# Patient Record
Sex: Male | Born: 1960 | Race: White | Marital: Married | Smoking: Former smoker
Health system: Southern US, Community
[De-identification: ages and names within clinical notes are randomized; demographics above are authoritative.]

## PROBLEM LIST (undated history)

## (undated) DIAGNOSIS — R0989 Other specified symptoms and signs involving the circulatory and respiratory systems: Secondary | ICD-10-CM

## (undated) DIAGNOSIS — E559 Vitamin D deficiency, unspecified: Secondary | ICD-10-CM

## (undated) DIAGNOSIS — E785 Hyperlipidemia, unspecified: Secondary | ICD-10-CM

## (undated) DIAGNOSIS — E291 Testicular hypofunction: Secondary | ICD-10-CM

## (undated) DIAGNOSIS — F988 Other specified behavioral and emotional disorders with onset usually occurring in childhood and adolescence: Secondary | ICD-10-CM

## (undated) DIAGNOSIS — R7303 Prediabetes: Secondary | ICD-10-CM

## (undated) HISTORY — DX: Other specified behavioral and emotional disorders with onset usually occurring in childhood and adolescence: F98.8

## (undated) HISTORY — DX: Hyperlipidemia, unspecified: E78.5

## (undated) HISTORY — DX: Prediabetes: R73.03

## (undated) HISTORY — DX: Testicular hypofunction: E29.1

## (undated) HISTORY — DX: Other specified symptoms and signs involving the circulatory and respiratory systems: R09.89

## (undated) HISTORY — DX: Vitamin D deficiency, unspecified: E55.9

---

## 1981-03-28 HISTORY — PX: ANKLE SURGERY: SHX546

## 1992-03-28 HISTORY — PX: VASECTOMY: SHX75

## 2004-03-28 HISTORY — PX: SHOULDER SURGERY: SHX246

## 2012-12-03 ENCOUNTER — Other Ambulatory Visit: Payer: Self-pay | Admitting: Internal Medicine

## 2012-12-03 DIAGNOSIS — R748 Abnormal levels of other serum enzymes: Secondary | ICD-10-CM

## 2012-12-06 ENCOUNTER — Other Ambulatory Visit: Payer: Self-pay

## 2013-02-13 ENCOUNTER — Other Ambulatory Visit: Payer: Self-pay | Admitting: *Deleted

## 2013-02-13 DIAGNOSIS — M25562 Pain in left knee: Secondary | ICD-10-CM

## 2013-02-14 ENCOUNTER — Ambulatory Visit
Admission: RE | Admit: 2013-02-14 | Discharge: 2013-02-14 | Disposition: A | Discharge status: Home / Self Care | Payer: No Typology Code available for payment source | Source: Ambulatory Visit | Attending: *Deleted | Admitting: *Deleted

## 2013-02-14 DIAGNOSIS — M25562 Pain in left knee: Secondary | ICD-10-CM

## 2013-03-04 ENCOUNTER — Other Ambulatory Visit: Payer: Self-pay | Admitting: Internal Medicine

## 2013-03-04 ENCOUNTER — Telehealth: Payer: Self-pay | Admitting: Internal Medicine

## 2013-03-04 MED ORDER — AMPHETAMINE-DEXTROAMPHETAMINE 20 MG PO TABS: ORAL_TABLET | ORAL | Status: DC

## 2013-03-04 NOTE — Telephone Encounter (Signed)
refill request Adderall 20MG  TID   Please advise when ready  Next Appt:04-10-13 OV ACollier

## 2013-03-04 NOTE — Telephone Encounter (Signed)
I need this chart please

## 2013-04-08 ENCOUNTER — Other Ambulatory Visit: Payer: Self-pay | Admitting: Emergency Medicine

## 2013-04-08 MED ORDER — AMPHETAMINE-DEXTROAMPHETAMINE 20 MG PO TABS: ORAL_TABLET | ORAL | Status: DC

## 2013-04-09 DIAGNOSIS — E349 Endocrine disorder, unspecified: Secondary | ICD-10-CM | POA: Insufficient documentation

## 2013-04-09 DIAGNOSIS — E785 Hyperlipidemia, unspecified: Secondary | ICD-10-CM | POA: Insufficient documentation

## 2013-04-09 DIAGNOSIS — R7303 Prediabetes: Secondary | ICD-10-CM | POA: Insufficient documentation

## 2013-04-09 DIAGNOSIS — R0989 Other specified symptoms and signs involving the circulatory and respiratory systems: Secondary | ICD-10-CM | POA: Insufficient documentation

## 2013-04-09 DIAGNOSIS — F988 Other specified behavioral and emotional disorders with onset usually occurring in childhood and adolescence: Secondary | ICD-10-CM | POA: Insufficient documentation

## 2013-04-10 ENCOUNTER — Encounter: Payer: Self-pay | Admitting: Physician Assistant

## 2013-04-10 ENCOUNTER — Ambulatory Visit (INDEPENDENT_AMBULATORY_CARE_PROVIDER_SITE_OTHER): Payer: No Typology Code available for payment source | Admitting: Physician Assistant

## 2013-04-10 VITALS — BP 129/84 | HR 76 | Temp 98.2°F | Resp 16 | Ht 71.0 in | Wt 236.0 lb

## 2013-04-10 DIAGNOSIS — I10 Essential (primary) hypertension: Secondary | ICD-10-CM

## 2013-04-10 DIAGNOSIS — R0989 Other specified symptoms and signs involving the circulatory and respiratory systems: Secondary | ICD-10-CM

## 2013-04-10 DIAGNOSIS — E559 Vitamin D deficiency, unspecified: Secondary | ICD-10-CM

## 2013-04-10 DIAGNOSIS — R7309 Other abnormal glucose: Secondary | ICD-10-CM

## 2013-04-10 DIAGNOSIS — Z79899 Other long term (current) drug therapy: Secondary | ICD-10-CM

## 2013-04-10 DIAGNOSIS — R7303 Prediabetes: Secondary | ICD-10-CM

## 2013-04-10 DIAGNOSIS — E785 Hyperlipidemia, unspecified: Secondary | ICD-10-CM

## 2013-04-10 LAB — HEMOGLOBIN A1C
Hgb A1c MFr Bld: 5.9 % — ABNORMAL HIGH (ref <5.7)
MEAN PLASMA GLUCOSE: 123 mg/dL — AB (ref <117)

## 2013-04-10 LAB — MAGNESIUM: Magnesium: 1.9 mg/dL (ref 1.5–2.5)

## 2013-04-10 LAB — LIPID PANEL
CHOLESTEROL: 156 mg/dL (ref 0–200)
HDL: 41 mg/dL (ref >39)
LDL CALC: 97 mg/dL (ref 0–99)
TRIGLYCERIDES: 92 mg/dL (ref <150)
Total CHOL/HDL Ratio: 3.8 Ratio
VLDL: 18 mg/dL (ref 0–40)

## 2013-04-10 LAB — CBC WITH DIFFERENTIAL/PLATELET
BASOS PCT: 0 % (ref 0–1)
Basophils Absolute: 0 10*3/uL (ref 0.0–0.1)
EOS ABS: 0.2 10*3/uL (ref 0.0–0.7)
Eosinophils Relative: 4 % (ref 0–5)
HEMATOCRIT: 47.7 % (ref 39.0–52.0)
HEMOGLOBIN: 16.7 g/dL (ref 13.0–17.0)
Lymphocytes Relative: 25 % (ref 12–46)
Lymphs Abs: 1.4 10*3/uL (ref 0.7–4.0)
MCH: 31.9 pg (ref 26.0–34.0)
MCHC: 35 g/dL (ref 30.0–36.0)
MCV: 91.2 fL (ref 78.0–100.0)
MONO ABS: 0.4 10*3/uL (ref 0.1–1.0)
MONOS PCT: 8 % (ref 3–12)
Neutro Abs: 3.6 10*3/uL (ref 1.7–7.7)
Neutrophils Relative %: 63 % (ref 43–77)
Platelets: 205 10*3/uL (ref 150–400)
RBC: 5.23 MIL/uL (ref 4.22–5.81)
RDW: 14.2 % (ref 11.5–15.5)
WBC: 5.6 10*3/uL (ref 4.0–10.5)

## 2013-04-10 LAB — BASIC METABOLIC PANEL WITH GFR
BUN: 19 mg/dL (ref 6–23)
CALCIUM: 9.5 mg/dL (ref 8.4–10.5)
CO2: 25 mEq/L (ref 19–32)
Chloride: 101 mEq/L (ref 96–112)
Creat: 0.84 mg/dL (ref 0.50–1.35)
GFR, Est Non African American: >89 mL/min
GLUCOSE: 111 mg/dL — AB (ref 70–99)
Potassium: 4.3 mEq/L (ref 3.5–5.3)
Sodium: 133 mEq/L — ABNORMAL LOW (ref 135–145)

## 2013-04-10 LAB — HEPATIC FUNCTION PANEL
ALT: 45 U/L (ref 0–53)
AST: 37 U/L (ref 0–37)
Albumin: 4.6 g/dL (ref 3.5–5.2)
Alkaline Phosphatase: 55 U/L (ref 39–117)
BILIRUBIN INDIRECT: 0.4 mg/dL (ref 0.0–0.9)
Bilirubin, Direct: 0.1 mg/dL (ref 0.0–0.3)
Total Bilirubin: 0.5 mg/dL (ref 0.3–1.2)
Total Protein: 7.2 g/dL (ref 6.0–8.3)

## 2013-04-10 LAB — TSH: TSH: 2.722 u[IU]/mL (ref 0.350–4.500)

## 2013-04-10 MED ORDER — AMPHETAMINE-DEXTROAMPHETAMINE 20 MG PO TABS: ORAL_TABLET | ORAL | Status: DC

## 2013-04-10 NOTE — Progress Notes (Signed)
HPI Patient presents for 3 month follow up with hypertension, hyperlipidemia, prediabetes and vitamin D. Patient's blood pressure has been controlled at home, today their BP is BP: 129/84 mmHg  Patient denies chest pain, shortness of breath, dizziness.  Patient's cholesterol is diet controlled. In addition they are on crestor, he does not take it daily and last visit he was not on it at all and denies myalgias. The cholesterol last visit was LDL 135, HDL 31.AST/ALT 39/57.  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 A1C 5.8 Patient is on Vitamin D supplement.   Current Medications:  Current Outpatient Prescriptions on File Prior to Visit  Medication Sig Dispense Refill  . amphetamine-dextroamphetamine (ADDERALL) 20 MG tablet 1/2 to 1 tablet 2 or 3 x daily as needed for ADD  90 tablet  0  . rosuvastatin (CRESTOR) 20 MG tablet Take 20 mg by mouth daily.      Marland Kitchen. testosterone cypionate (DEPOTESTOTERONE CYPIONATE) 200 MG/ML injection Inject 200 mg into the muscle every 14 (fourteen) days.       No current facility-administered medications on file prior to visit.   Medical History:  Past Medical History  Diagnosis Date  . Hyperlipidemia   . Labile hypertension   . ADD (attention deficit disorder)   . Vitamin D deficiency   . Other testicular hypofunction   . Prediabetes    Allergies: No Known Allergies  ROS Constitutional: Denies fever, chills, headaches, insomnia, fatigue, night sweats Eyes: Denies redness, blurred vision, diplopia, discharge, itchy, watery eyes.  ENT: Denies congestion, post nasal drip, sore throat, earache, dental pain, Tinnitus, Vertigo, Sinus pain, snoring.  Cardio: Denies chest pain, palpitations, irregular heartbeat, dyspnea, diaphoresis, orthopnea, PND, claudication, edema Respiratory: denies cough, shortness of breath, wheezing.  Gastrointestinal:  Denies dysphagia, heartburn, AB pain/ cramps, N/V, diarrhea, constipation, hematemesis, melena, hematochezia,  hemorrhoids Genitourinary: Denies dysuria, frequency, urgency, nocturia, hesitancy, discharge, hematuria, flank pain Musculoskeletal: Denies myalgia, stiffness, pain, swelling and strain/sprain. Skin: Denies pruritis, rash, changing in skin lesion Neuro: Denies Weakness, tremor, incoordination, spasms, pain Psychiatric: Denies confusion, memory loss, sensory loss Endocrine: Denies change in weight, skin, hair change, nocturia Diabetic Polys, Denies visual blurring, hyper /hypo glycemic episodes, and paresthesia, Heme/Lymph: Denies Excessive bleeding, bruising, enlarged lymph nodes  Family history- Review and unchanged Social history- Review and unchanged Physical Exam: Filed Vitals:   04/10/13 0929  BP: 129/84  Pulse: 76  Temp: 98.2 F (36.8 C)  Resp: 16   Filed Weights   04/10/13 0929  Weight: 236 lb (107.049 kg)   General Appearance: Well nourished, in no apparent distress. Eyes: PERRLA, EOMs, conjunctiva no swelling or erythema Sinuses: No Frontal/maxillary tenderness ENT/Mouth: Ext aud canals clear, TMs without erythema, bulging. No erythema, swelling, or exudate on post pharynx.  Tonsils not swollen or erythematous. Hearing normal.  Neck: Supple, thyroid normal.  Respiratory: Respiratory effort normal, BS equal bilaterally without rales, rhonchi, wheezing or stridor.  Cardio: RRR with no MRGs. Brisk peripheral pulses without edema.  Abdomen: Soft, + BS.  Non tender, no guarding, rebound, hernias, masses. Lymphatics: Non tender without lymphadenopathy.  Musculoskeletal: Full ROM, 5/5 strength, normal gait.  Skin: Warm, dry without rashes, lesions, ecchymosis.  Neuro: Cranial nerves intact. Normal muscle tone, no cerebellar symptoms. Sensation intact.  Psych: Awake and oriented X 3, normal affect, Insight and Judgment appropriate.   Assessment and Plan:   Hypertension: Continue  medication, monitor blood pressure at home.  Continue DASH diet. Cholesterol: Continue diet and exercise. Check cholesterol.  Pre-diabetes-Continue diet and exercise. Check A1C Vitamin D Def- check level and continue medications.  Elevated LFTs- will check LFTs again and likely get AB [place] if still elevated.   Continue diet and meds as discussed. Further disposition pending results of labs.  Quentin Mulling 9:34 AM

## 2013-04-10 NOTE — Patient Instructions (Signed)
Bad carbs also include fruit juice, alcohol, and sweet tea. These are empty calories that do not signal to your brain that you are full.   Please remember the good carbs are still carbs which convert into sugar. So please measure them out no more than 1/2-1 cup of rice, oatmeal, pasta, and beans.  Veggies are however free foods! Pile them on.   I like lean protein at every meal such as chicken, [place], pork chops, cottage cheese, etc. Just do not fry these meats and please center your meal around vegetable, the meats should be a side dish.   No all fruit is created equal. Please see the list below, the fruit at the bottom is higher in sugars than the fruit at the top  Your LDL is not in range. Your LDL is the bad cholesterol that can lead to heart attack and stroke. To lower your number you can decrease your fatty foods, red meat, cheese, milk and increase fiber like whole grains and veggies. You can also add a fiber supplement like Metamucil or Benefiber.   Fat and Cholesterol Control Diet Fat and cholesterol levels in your blood and organs are influenced by your diet. High levels of fat and cholesterol may lead to diseases of the heart, small and large blood vessels, gallbladder, liver, and pancreas. CONTROLLING FAT AND CHOLESTEROL WITH DIET Although exercise and lifestyle factors are important, your diet is key. That is because certain foods are known to raise cholesterol and others to lower it. The goal is to balance foods for their effect on cholesterol and more importantly, to replace saturated and trans fat with other types of fat, such as monounsaturated fat, polyunsaturated fat, and omega-3 fatty acids. On average, a person should consume no more than 15 to 17 g of saturated fat daily. Saturated and trans fats are considered "bad" fats, and they will raise LDL cholesterol. Saturated fats are primarily found in animal products such as meats, butter, and cream. However, that does not mean  you need to give up all your favorite foods. Today, there are good tasting, low-fat, low-cholesterol substitutes for most of the things you like to eat. Choose low-fat or nonfat alternatives. Choose round or loin cuts of red meat. These types of cuts are lowest in fat and cholesterol. Chicken (without the skin), fish, veal, and ground [place] breast are great choices. Eliminate fatty meats, such as hot dogs and salami. Even shellfish have little or no saturated fat. Have a 3 oz (85 g) portion when you eat lean meat, poultry, or fish. Trans fats are also called "partially hydrogenated oils." They are oils that have been scientifically manipulated so that they are solid at room temperature resulting in a longer shelf life and improved taste and texture of foods in which they are added. Trans fats are found in stick margarine, some tub margarines, cookies, crackers, and baked goods.  When baking and cooking, oils are a great substitute for butter. The monounsaturated oils are especially beneficial since it is believed they lower LDL and raise HDL. The oils you should avoid entirely are saturated tropical oils, such as coconut and palm.  Remember to eat a lot from food groups that are naturally free of saturated and trans fat, including fish, fruit, vegetables, beans, grains (barley, rice, couscous, bulgur wheat), and pasta (without cream sauces).  IDENTIFYING FOODS THAT LOWER FAT AND CHOLESTEROL  Soluble fiber may lower your cholesterol. This type of fiber is found in fruits such as apples,  vegetables such as broccoli, potatoes, and carrots, legumes such as beans, peas, and lentils, and grains such as barley. Foods fortified with plant sterols (phytosterol) may also lower cholesterol. You should eat at least 2 g per day of these foods for a cholesterol lowering effect.  Read package labels to identify low-saturated fats, trans fat free, and low-fat foods at the supermarket. Select cheeses that have only 2 to 3 g  saturated fat per ounce. Use a heart-healthy tub margarine that is free of trans fats or partially hydrogenated oil. When buying baked goods (cookies, crackers), avoid partially hydrogenated oils. Breads and muffins should be made from whole grains (whole-wheat or whole oat flour, instead of "flour" or "enriched flour"). Buy non-creamy canned soups with reduced salt and no added fats.  FOOD PREPARATION TECHNIQUES  Never deep-fry. If you must fry, either stir-fry, which uses very little fat, or use non-stick cooking sprays. When possible, broil, bake, or roast meats, and steam vegetables. Instead of putting butter or margarine on vegetables, use lemon and herbs, applesauce, and cinnamon (for squash and sweet potatoes). Use nonfat yogurt, salsa, and low-fat dressings for salads.  LOW-SATURATED FAT / LOW-FAT FOOD SUBSTITUTES Meats / Saturated Fat (g)  Avoid: Steak, marbled (3 oz/85 g) / 11 g  Choose: Steak, lean (3 oz/85 g) / 4 g  Avoid: Hamburger (3 oz/85 g) / 7 g  Choose: Hamburger, lean (3 oz/85 g) / 5 g  Avoid: Ham (3 oz/85 g) / 6 g  Choose: Ham, lean cut (3 oz/85 g) / 2.4 g  Avoid: Chicken, with skin, dark meat (3 oz/85 g) / 4 g  Choose: Chicken, skin removed, dark meat (3 oz/85 g) / 2 g  Avoid: Chicken, with skin, light meat (3 oz/85 g) / 2.5 g  Choose: Chicken, skin removed, light meat (3 oz/85 g) / 1 g Dairy / Saturated Fat (g)  Avoid: Whole milk (1 cup) / 5 g  Choose: Low-fat milk, 2% (1 cup) / 3 g  Choose: Low-fat milk, 1% (1 cup) / 1.5 g  Choose: Skim milk (1 cup) / 0.3 g  Avoid: Hard cheese (1 oz/28 g) / 6 g  Choose: Skim milk cheese (1 oz/28 g) / 2 to 3 g  Avoid: Cottage cheese, 4% fat (1 cup) / 6.5 g  Choose: Low-fat cottage cheese, 1% fat (1 cup) / 1.5 g  Avoid: Ice cream (1 cup) / 9 g  Choose: Sherbet (1 cup) / 2.5 g  Choose: Nonfat frozen yogurt (1 cup) / 0.3 g  Choose: Frozen fruit bar / trace  Avoid: Whipped cream (1 tbs) / 3.5 g  Choose: Nondairy  whipped topping (1 tbs) / 1 g Condiments / Saturated Fat (g)  Avoid: Mayonnaise (1 tbs) / 2 g  Choose: Low-fat mayonnaise (1 tbs) / 1 g  Avoid: Butter (1 tbs) / 7 g  Choose: Extra light margarine (1 tbs) / 1 g  Avoid: Coconut oil (1 tbs) / 11.8 g  Choose: Olive oil (1 tbs) / 1.8 g  Choose: Corn oil (1 tbs) / 1.7 g  Choose: Safflower oil (1 tbs) / 1.2 g  Choose: Sunflower oil (1 tbs) / 1.4 g  Choose: Soybean oil (1 tbs) / 2.4 g  Choose: Canola oil (1 tbs) / 1 g Document Released: 03/14/2005 Document Revised: 07/09/2012 Document Reviewed: 09/02/2010 [employer] Patient Information 2014 [employer] [place].

## 2013-04-11 LAB — VITAMIN D 25 HYDROXY (VIT D DEFICIENCY, FRACTURES): VIT D 25 HYDROXY: 88 ng/mL (ref 30–89)

## 2013-05-10 ENCOUNTER — Other Ambulatory Visit: Payer: Self-pay | Admitting: Internal Medicine

## 2013-05-10 MED ORDER — AMPHETAMINE-DEXTROAMPHETAMINE 20 MG PO TABS: ORAL_TABLET | ORAL | Status: DC

## 2013-07-09 ENCOUNTER — Encounter: Payer: Self-pay | Admitting: Internal Medicine

## 2013-07-09 ENCOUNTER — Ambulatory Visit (INDEPENDENT_AMBULATORY_CARE_PROVIDER_SITE_OTHER): Payer: No Typology Code available for payment source | Admitting: Internal Medicine

## 2013-07-09 VITALS — BP 120/74 | HR 80 | Temp 98.1°F | Resp 16 | Ht 71.0 in | Wt 238.4 lb

## 2013-07-09 DIAGNOSIS — R7309 Other abnormal glucose: Secondary | ICD-10-CM

## 2013-07-09 DIAGNOSIS — E782 Mixed hyperlipidemia: Secondary | ICD-10-CM

## 2013-07-09 DIAGNOSIS — E785 Hyperlipidemia, unspecified: Secondary | ICD-10-CM

## 2013-07-09 DIAGNOSIS — R7303 Prediabetes: Secondary | ICD-10-CM

## 2013-07-09 DIAGNOSIS — E559 Vitamin D deficiency, unspecified: Secondary | ICD-10-CM

## 2013-07-09 DIAGNOSIS — E291 Testicular hypofunction: Secondary | ICD-10-CM

## 2013-07-09 DIAGNOSIS — I10 Essential (primary) hypertension: Secondary | ICD-10-CM

## 2013-07-09 DIAGNOSIS — Z79899 Other long term (current) drug therapy: Secondary | ICD-10-CM

## 2013-07-09 DIAGNOSIS — R0989 Other specified symptoms and signs involving the circulatory and respiratory systems: Secondary | ICD-10-CM

## 2013-07-09 LAB — CBC WITH DIFFERENTIAL/PLATELET
Basophils Absolute: 0.1 10*3/uL (ref 0.0–0.1)
Basophils Relative: 1 % (ref 0–1)
EOS PCT: 2 % (ref 0–5)
Eosinophils Absolute: 0.1 10*3/uL (ref 0.0–0.7)
HEMATOCRIT: 50.4 % (ref 39.0–52.0)
HEMOGLOBIN: 17.6 g/dL — AB (ref 13.0–17.0)
LYMPHS ABS: 1.6 10*3/uL (ref 0.7–4.0)
LYMPHS PCT: 26 % (ref 12–46)
MCH: 31.5 pg (ref 26.0–34.0)
MCHC: 34.9 g/dL (ref 30.0–36.0)
MCV: 90.3 fL (ref 78.0–100.0)
MONO ABS: 0.7 10*3/uL (ref 0.1–1.0)
MONOS PCT: 12 % (ref 3–12)
Neutro Abs: 3.5 10*3/uL (ref 1.7–7.7)
Neutrophils Relative %: 59 % (ref 43–77)
Platelets: 173 10*3/uL (ref 150–400)
RBC: 5.58 MIL/uL (ref 4.22–5.81)
RDW: 13.7 % (ref 11.5–15.5)
WBC: 6 10*3/uL (ref 4.0–10.5)

## 2013-07-09 LAB — HEMOGLOBIN A1C
Hgb A1c MFr Bld: 5.5 % (ref <5.7)
MEAN PLASMA GLUCOSE: 111 mg/dL (ref <117)

## 2013-07-09 MED ORDER — AMPHETAMINE-DEXTROAMPHETAMINE 20 MG PO TABS: ORAL_TABLET | ORAL | Status: DC

## 2013-07-09 MED ORDER — CLOMIPHENE CITRATE 50 MG PO TABS: 50.0000 mg | ORAL_TABLET | Freq: Every day | ORAL | Status: AC

## 2013-07-09 NOTE — Progress Notes (Signed)
Patient ID: Joshua CasterRobert Bodi, male   DOB: Apr 08, 1960, 53 y.o.   MRN: [id number]    This very nice 53 y.o. male presents for 3 month follow up with Hypertension, Hyperlipidemia, Pre-Diabetes and Vitamin D Deficiency. Patient is also treated for ADD with marked improvement in focusing and concentration on treatment.    HTN predates since 2002. BP has been controlled at home. Today's BP: 120/74 mmHg . Patient denies any cardiac type chest pain, palpitations, dyspnea/orthopnea/PND, dizziness, claudication, or dependent edema.   Hyperlipidemia is controlled with diet & meds. Last Cholesterol was  156, Triglycerides were  92, HDL  41 and LDL 97 in Jan 2015 - all at goal. Patient denies myalgias or other med SE's.    Also, the patient has history of PreDiabetes with A1c 5.8% in 2011 and with last A1c of  5.9% in Jan 2015. Patient denies any symptoms of reactive hypoglycemia, diabetic polys, paresthesias or visual blurring.   Patient has testosterone deficiency and has been on replacement with Depo-testosterone. Further, Patient has history of Vitamin D Deficiency  Of 16 in 2008 with last vitamin D of 62 in Oct 2014. Patient supplements vitamin D without any suspected side-effects.  Medication Sig  .  ADDERALL 20 MG tablet 1/2 to 1 tablet 2 or 3 x daily as needed for ADD  . rosuvastatin (CRESTOR) 20 MG tablet Take 20 mg by mouth daily.  . DEPOTESTOTERONE 200 MG/ML  Inject 200 mg into the muscle every 14 (fourteen) days.    No Known Allergies  PMHx:   Past Medical History  Diagnosis Date  . Hyperlipidemia   . Labile hypertension   . ADD (attention deficit disorder)   . Vitamin D deficiency   . Other testicular hypofunction   . Prediabetes    FHx:    Reviewed / unchanged  SHx:    Reviewed / unchanged   Systems Review: Constitutional: Denies fever, chills, wt changes, headaches, insomnia, fatigue, night sweats, change in appetite. Eyes: Denies redness, blurred vision, diplopia, discharge, itchy,  watery eyes.  ENT: Denies discharge, congestion, post nasal drip, epistaxis, sore throat, earache, hearing loss, dental pain, tinnitus, vertigo, sinus pain, snoring.  CV: Denies chest pain, palpitations, irregular heartbeat, syncope, dyspnea, diaphoresis, orthopnea, PND, claudication, edema. Respiratory: denies cough, dyspnea, DOE, pleurisy, hoarseness, laryngitis, wheezing.  Gastrointestinal: Denies dysphagia, odynophagia, heartburn, reflux, water brash, abdominal pain or cramps, nausea, vomiting, bloating, diarrhea, constipation, hematemesis, melena, hematochezia,  or hemorrhoids. Genitourinary: Denies dysuria, frequency, urgency, nocturia, hesitancy, discharge, hematuria, flank pain. Musculoskeletal: Denies arthralgias, myalgias, stiffness, jt. swelling, pain, limp, strain/sprain.  Skin: Denies pruritus, rash, hives, warts, acne, eczema, change in skin lesion(s). Neuro: No weakness, tremor, incoordination, spasms, paresthesia, or pain. Psychiatric: Denies confusion, memory loss, or sensory loss. Endo: Denies change in weight, skin, hair change.  Heme/Lymph: No excessive bleeding, bruising, orenlarged lymph nodes.  Exam:    BP 120/74  Pulse 80  Temp 98.1 F   Resp 16  Ht 5\' 11"    Wt 238 lb 6.4 oz   BMI 33.26 kg/m2  Appears well nourished - in no distress. Eyes: PERRLA, EOMs, conjunctiva no swelling or erythema. Sinuses: No frontal/maxillary tenderness ENT/Mouth: EAC's clear, TM's nl w/o erythema, bulging. Nares clear w/o erythema, swelling, exudates. Oropharynx clear without erythema or exudates. Oral hygiene is good. Tongue normal, non obstructing. Hearing intact.  Neck: Supple. Thyroid nl. Car 2+/2+ without bruits, nodes or JVD. Chest: Respirations nl with BS clear & equal w/o rales, rhonchi, wheezing or stridor.  Cor: Heart  sounds normal w/ regular rate and rhythm without sig. murmurs, gallops, clicks, or rubs. Peripheral pulses normal and equal  without edema.  Abdomen: Soft &  bowel sounds normal. Non-tender w/o guarding, rebound, hernias, masses, or organomegaly.  Lymphatics: Unremarkable.  Musculoskeletal: Full ROM all peripheral extremities, joint stability, 5/5 strength, and normal gait.  Skin: Warm, dry without exposed rashes, lesions, ecchymosis apparent.  Neuro: Cranial nerves intact, reflexes equal bilaterally. Sensory-motor testing grossly intact. Tendon reflexes grossly intact.  Pysch: Alert & oriented x 3. Insight and judgement nl & appropriate. No ideations.  Assessment and Plan:  1. Hypertension - Continue monitor blood pressure at home. Continue diet/meds same.  2. Hyperlipidemia - Continue diet/meds, exercise,& lifestyle modifications. Continue monitor periodic cholesterol/liver & renal functions   3. Pre-diabetes - Continue diet, exercise, lifestyle modifications. Monitor appropriate labs.  4. Vitamin D Deficiency - Continue supplementation.  5. Testosterone Deficiency - In discussion with a urologist and the patient he is willing to try treatment with Clomid 50 mg qd in lieu of D-T injections and re-check levels in 3 months on Tx.  Recommended regular exercise, BP monitoring, weight control, and discussed med and SE's. Recommended labs to assess and monitor clinical status. Further disposition pending results of labs.

## 2013-07-09 NOTE — Patient Instructions (Signed)
 Testosterone injection What is this medicine? TESTOSTERONE (tes TOS ter one) is the main male hormone. It supports normal male development such as muscle growth, facial hair, and deep voice. It is used in males to treat low testosterone levels. This medicine may be used for other purposes; ask your health care provider or pharmacist if you have questions. COMMON BRAND NAME(S): Andro-L.A., Aveed, Delatestryl, Depo-Testosterone, Virilon What should I tell my health care provider before I take this medicine? They need to know if you have any of these conditions: -breast cancer -diabetes -heart disease -kidney disease -liver disease -lung disease -prostate cancer, enlargement -an unusual or allergic reaction to testosterone, other medicines, foods, dyes, or preservatives -pregnant or trying to get pregnant -breast-feeding How should I use this medicine? This medicine is for injection into a muscle. It is usually given by a health care professional in a hospital or clinic setting. Contact your pediatrician regarding the use of this medicine in children. While this medicine may be prescribed for children as young as 12 years of age for selected conditions, precautions do apply. Overdosage: If you think you have taken too much of this medicine contact a poison control center or emergency room at once. NOTE: This medicine is only for you. Do not share this medicine with others. What if I miss a dose? Try not to miss a dose. Your doctor or health care professional will tell you when your next injection is due. Notify the office if you are unable to keep an appointment. What may interact with this medicine? -medicines for diabetes -medicines that treat or prevent blood clots like warfarin -oxyphenbutazone -propranolol -steroid medicines like prednisone or cortisone This list may not describe all possible interactions. Give your health care provider a list of all the medicines, herbs,  non-prescription drugs, or dietary supplements you use. Also tell them if you smoke, drink alcohol, or use illegal drugs. Some items may interact with your medicine. What should I watch for while using this medicine? Visit your doctor or health care professional for regular checks on your progress. They will need to check the level of testosterone in your blood. This medicine may affect blood sugar levels. If you have diabetes, check with your doctor or health care professional before you change your diet or the dose of your diabetic medicine. This drug is banned from use in athletes by most athletic organizations. What side effects may I notice from receiving this medicine? Side effects that you should report to your doctor or health care professional as soon as possible: -allergic reactions like skin rash, itching or hives, swelling of the face, lips, or tongue -breast enlargement -breathing problems -changes in mood, especially anger, depression, or rage -dark urine -general ill feeling or flu-like symptoms -light-colored stools -loss of appetite, nausea -nausea, vomiting -right upper belly pain -stomach pain -swelling of ankles -too frequent or persistent erections -trouble passing urine or change in the amount of urine -unusually weak or tired -yellowing of the eyes or skin Additional side effects that can occur in women include: -deep or hoarse voice -facial hair growth -irregular menstrual periods Side effects that usually do not require medical attention (report to your doctor or health care professional if they continue or are bothersome): -acne -change in sex drive or performance -hair loss -headache This list may not describe all possible side effects. Call your doctor for medical advice about side effects. You may report side effects to FDA at 1-800-FDA-1088. Where should I keep my medicine?   Keep out of the reach of children. This medicine can be abused. Keep your  medicine in a safe place to protect it from theft. Do not share this medicine with anyone. Selling or giving away this medicine is dangerous and against the law. Store at room temperature between 20 and 25 degrees C (68 and 77 degrees F). Do not freeze. Protect from light. Follow the directions for the product you are prescribed. Throw away any unused medicine after the expiration date. NOTE: This sheet is a summary. It may not cover all possible information. If you have questions about this medicine, talk to your doctor, pharmacist, or health care provider.  2014, [employer]vier/Gold Standard. (2007-05-25 16:13:46)  Hypertension As your heart beats, it forces blood through your arteries. This force is your blood pressure. If the pressure is too high, it is called hypertension (HTN) or high blood pressure. HTN is dangerous because you may have it and not know it. High blood pressure may mean that your heart has to work harder to pump blood. Your arteries may be narrow or stiff. The extra work puts you at risk for heart disease, stroke, and other problems.  Blood pressure consists of two numbers, a higher number over a lower, 110/72, for example. It is stated as "110 over 72." The ideal is below 120 for the top number (systolic) and under 80 for the bottom (diastolic). Write down your blood pressure today. You should pay close attention to your blood pressure if you have certain conditions such as:  Heart failure.  Prior heart attack.  Diabetes  Chronic kidney disease.  Prior stroke.  Multiple risk factors for heart disease. To see if you have HTN, your blood pressure should be measured while you are seated with your arm held at the level of the heart. It should be measured at least twice. A one-time elevated blood pressure reading (especially in the Emergency Department) does not mean that you need treatment. There may be conditions in which the blood pressure is different between your right and left  arms. It is important to see your caregiver soon for a recheck. Most people have essential hypertension which means that there is not a specific cause. This type of high blood pressure may be lowered by changing lifestyle factors such as:  Stress.  Smoking.  Lack of exercise.  Excessive weight.  Drug/tobacco/alcohol use.  Eating less salt. Most people do not have symptoms from high blood pressure until it has caused damage to the body. Effective treatment can often prevent, delay or reduce that damage. TREATMENT  When a cause has been identified, treatment for high blood pressure is directed at the cause. There are a large number of medications to treat HTN. These fall into several categories, and your caregiver will help you select the medicines that are best for you. Medications may have side effects. You should review side effects with your caregiver. If your blood pressure stays high after you have made lifestyle changes or started on medicines,   Your medication(s) may need to be changed.  Other problems may need to be addressed.  Be certain you understand your prescriptions, and know how and when to take your medicine.  Be sure to follow up with your caregiver within the time frame advised (usually within two weeks) to have your blood pressure rechecked and to review your medications.  If you are taking more than one medicine to lower your blood pressure, make sure you know how and at what times   they should be taken. Taking two medicines at the same time can result in blood pressure that is too low. SEEK IMMEDIATE MEDICAL CARE IF:  You develop a severe headache, blurred or changing vision, or confusion.  You have unusual weakness or numbness, or a faint feeling.  You have severe chest or abdominal pain, vomiting, or breathing problems. MAKE SURE YOU:   Understand these instructions.  Will watch your condition.  Will get help right away if you are not doing well or get  worse.   Diabetes and Exercise Exercising regularly is important. It is not just about losing weight. It has many health benefits, such as:  Improving your overall fitness, flexibility, and endurance.  Increasing your bone density.  Helping with weight control.  Decreasing your body fat.  Increasing your muscle strength.  Reducing stress and tension.  Improving your overall health. People with diabetes who exercise gain additional benefits because exercise:  Reduces appetite.  Improves the body's use of blood sugar (glucose).  Helps lower or control blood glucose.  Decreases blood pressure.  Helps control blood lipids (such as cholesterol and triglycerides).  Improves the body's use of the hormone insulin by:  Increasing the body's insulin sensitivity.  Reducing the body's insulin needs.  Decreases the risk for heart disease because exercising:  Lowers cholesterol and triglycerides levels.  Increases the levels of good cholesterol (such as high-density lipoproteins [HDL]) in the body.  Lowers blood glucose levels. YOUR ACTIVITY PLAN  Choose an activity that you enjoy and set realistic goals. Your health care provider or diabetes educator can help you make an activity plan that works for you. You can break activities into 2 or 3 sessions throughout the day. Doing so is as good as one long session. Exercise ideas include:  Taking the dog for a walk.  Taking the stairs instead of the elevator.  Dancing to your favorite song.  Doing your favorite exercise with a friend. RECOMMENDATIONS FOR EXERCISING WITH TYPE 1 OR TYPE 2 DIABETES   Check your blood glucose before exercising. If blood glucose levels are greater than 240 mg/dL, check for urine ketones. Do not exercise if ketones are present.  Avoid injecting insulin into areas of the body that are going to be exercised. For example, avoid injecting insulin into:  The arms when playing tennis.  The legs when  jogging.  Keep a record of:  Food intake before and after you exercise.  Expected peak times of insulin action.  Blood glucose levels before and after you exercise.  The type and amount of exercise you have done.  Review your records with your health care provider. Your health care provider will help you to develop guidelines for adjusting food intake and insulin amounts before and after exercising.  If you take insulin or oral hypoglycemic agents, watch for signs and symptoms of hypoglycemia. They include:  Dizziness.  Shaking.  Sweating.  Chills.  Confusion.  Drink plenty of water while you exercise to prevent dehydration or heat stroke. Body water is lost during exercise and must be replaced.  Talk to your health care provider before starting an exercise program to make sure it is safe for you. Remember, almost any type of activity is better than none.    Cholesterol Cholesterol is a white, waxy, fat-like protein needed by your body in small amounts. The liver makes all the cholesterol you need. It is carried from the liver by the blood through the blood vessels. Deposits (plaque) may build   up on blood vessel walls. This makes the arteries narrower and stiffer. Plaque increases the risk for heart attack and stroke. You cannot feel your cholesterol level even if it is very high. The only way to know is by a blood test to check your lipid (fats) levels. Once you know your cholesterol levels, you should keep a record of the test results. Work with your caregiver to to keep your levels in the desired range. WHAT THE RESULTS MEAN:  Total cholesterol is a rough measure of all the cholesterol in your blood.  LDL is the so-called bad cholesterol. This is the type that deposits cholesterol in the walls of the arteries. You want this level to be low.  HDL is the good cholesterol because it cleans the arteries and carries the LDL away. You want this level to be high.  Triglycerides  are fat that the body can either burn for energy or store. High levels are closely linked to heart disease. DESIRED LEVELS:  Total cholesterol below 200.  LDL below 100 for people at risk, below 70 for very high risk.  HDL above 50 is good, above 60 is best.  Triglycerides below 150. HOW TO LOWER YOUR CHOLESTEROL:  Diet.  Choose fish or white meat chicken and turkey, roasted or baked. Limit fatty cuts of red meat, fried foods, and processed meats, such as sausage and lunch meat.  Eat lots of fresh fruits and vegetables. Choose whole grains, beans, pasta, potatoes and cereals.  Use only small amounts of olive, corn or canola oils. Avoid butter, mayonnaise, shortening or palm kernel oils. Avoid foods with trans-fats.  Use skim/nonfat milk and low-fat/nonfat yogurt and cheeses. Avoid whole milk, cream, ice cream, egg yolks and cheeses. Healthy desserts include angel food cake, ginger snaps, animal crackers, hard candy, popsicles, and low-fat/nonfat frozen yogurt. Avoid pastries, cakes, pies and cookies.  Exercise.  A regular program helps decrease LDL and raises HDL.  Helps with weight control.  Do things that increase your activity level like gardening, walking, or taking the stairs.  Medication.  May be prescribed by your caregiver to help lowering cholesterol and the risk for heart disease.  You may need medicine even if your levels are normal if you have several risk factors. HOME CARE INSTRUCTIONS   Follow your diet and exercise programs as suggested by your caregiver.  Take medications as directed.  Have blood work done when your caregiver feels it is necessary. MAKE SURE YOU:   Understand these instructions.  Will watch your condition.  Will get help right away if you are not doing well or get worse.      Vitamin D Deficiency Vitamin D is an important vitamin that your body needs. Having too little of it in your body is called a deficiency. A very bad  deficiency can make your bones soft and can cause a condition called rickets.  Vitamin D is important to your body for different reasons, such as:   It helps your body absorb 2 minerals called calcium and phosphorus.  It helps make your bones healthy.  It may prevent some diseases, such as diabetes and multiple sclerosis.  It helps your muscles and heart. You can get vitamin D in several ways. It is a natural part of some foods. The vitamin is also added to some dairy products and cereals. Some people take vitamin D supplements. Also, your body makes vitamin D when you are in the sun. It changes the sun's rays into a   form of the vitamin that your body can use. CAUSES   Not eating enough foods that contain vitamin D.  Not getting enough sunlight.  Having certain digestive system diseases that make it hard to absorb vitamin D. These diseases include Crohn's disease, chronic pancreatitis, and cystic fibrosis.  Having a surgery in which part of the stomach or small intestine is removed.  Being obese. Fat cells pull vitamin D out of your blood. That means that obese people may not have enough vitamin D left in their blood and in other body tissues.  Having chronic kidney or liver disease. RISK FACTORS Risk factors are things that make you more likely to develop a vitamin D deficiency. They include:  Being older.  Not being able to get outside very much.  Living in a nursing home.  Having had broken bones.  Having weak or thin bones (osteoporosis).  Having a disease or condition that changes how your body absorbs vitamin D.  Having dark skin.  Some medicines such as seizure medicines or steroids.  Being overweight or obese. SYMPTOMS Mild cases of vitamin D deficiency may not have any symptoms. If you have a very bad case, symptoms may include:  Bone pain.  Muscle pain.  Falling often.  Broken bones caused by a minor injury, due to osteoporosis. DIAGNOSIS A blood test  is the best way to tell if you have a vitamin D deficiency. TREATMENT Vitamin D deficiency can be treated in different ways. Treatment for vitamin D deficiency depends on what is causing it. Options include:  Taking vitamin D supplements.  Taking a calcium supplement. Your caregiver will suggest what dose is best for you. HOME CARE INSTRUCTIONS  Take any supplements that your caregiver prescribes. Follow the directions carefully. Take only the suggested amount.  Have your blood tested 2 months after you start taking supplements.  Eat foods that contain vitamin D. Healthy choices include:  Fortified dairy products, cereals, or juices. Fortified means vitamin D has been added to the food. Check the label on the package to be sure.  Fatty fish like salmon or trout.  Eggs.  Oysters.  Do not use a tanning bed.  Keep your weight at a healthy level. Lose weight if you need to.  Keep all follow-up appointments. Your caregiver will need to perform blood tests to make sure your vitamin D deficiency is going away. SEEK MEDICAL CARE IF:  You have any questions about your treatment.  You continue to have symptoms of vitamin D deficiency.  You have nausea or vomiting.  You are constipated.  You feel confused.  You have severe abdominal or back pain. MAKE SURE YOU:  Understand these instructions.  Will watch your condition.  Will get help right away if you are not doing well or get worse.   

## 2013-07-10 LAB — HEPATIC FUNCTION PANEL
ALBUMIN: 4.1 g/dL (ref 3.5–5.2)
ALK PHOS: 51 U/L (ref 39–117)
ALT: 42 U/L (ref 0–53)
AST: 35 U/L (ref 0–37)
Bilirubin, Direct: 0.1 mg/dL (ref 0.0–0.3)
Indirect Bilirubin: 0.5 mg/dL (ref 0.2–1.2)
TOTAL PROTEIN: 6.6 g/dL (ref 6.0–8.3)
Total Bilirubin: 0.6 mg/dL (ref 0.2–1.2)

## 2013-07-10 LAB — BASIC METABOLIC PANEL WITH GFR
BUN: 19 mg/dL (ref 6–23)
CHLORIDE: 102 meq/L (ref 96–112)
CO2: 28 mEq/L (ref 19–32)
CREATININE: 0.93 mg/dL (ref 0.50–1.35)
Calcium: 9.2 mg/dL (ref 8.4–10.5)
GFR, Est African American: >89 mL/min
GFR, Est Non African American: >89 mL/min
GLUCOSE: 89 mg/dL (ref 70–99)
Potassium: 4.1 mEq/L (ref 3.5–5.3)
Sodium: 138 mEq/L (ref 135–145)

## 2013-07-10 LAB — INSULIN, FASTING: INSULIN FASTING, SERUM: 14 u[IU]/mL (ref 3–28)

## 2013-07-10 LAB — TESTOSTERONE: Testosterone: 624 ng/dL (ref 300–890)

## 2013-07-10 LAB — TSH: TSH: 2.553 u[IU]/mL (ref 0.350–4.500)

## 2013-07-10 LAB — LIPID PANEL
Cholesterol: 131 mg/dL (ref 0–200)
HDL: 27 mg/dL — ABNORMAL LOW (ref >39)
LDL CALC: 78 mg/dL (ref 0–99)
Total CHOL/HDL Ratio: 4.9 Ratio
Triglycerides: 130 mg/dL (ref <150)
VLDL: 26 mg/dL (ref 0–40)

## 2013-07-10 LAB — VITAMIN D 25 HYDROXY (VIT D DEFICIENCY, FRACTURES): Vit D, 25-Hydroxy: 68 ng/mL (ref 30–89)

## 2013-07-10 LAB — MAGNESIUM: Magnesium: 1.9 mg/dL (ref 1.5–2.5)

## 2013-08-12 ENCOUNTER — Other Ambulatory Visit: Payer: Self-pay | Admitting: Internal Medicine

## 2013-08-12 DIAGNOSIS — F988 Other specified behavioral and emotional disorders with onset usually occurring in childhood and adolescence: Secondary | ICD-10-CM

## 2013-08-12 DIAGNOSIS — E782 Mixed hyperlipidemia: Secondary | ICD-10-CM

## 2013-08-12 MED ORDER — ROSUVASTATIN CALCIUM 20 MG PO TABS: 20.0000 mg | ORAL_TABLET | Freq: Every day | ORAL | Status: DC

## 2013-08-12 MED ORDER — AMPHETAMINE-DEXTROAMPHETAMINE 20 MG PO TABS: ORAL_TABLET | ORAL | Status: DC

## 2013-09-11 ENCOUNTER — Other Ambulatory Visit: Payer: Self-pay | Admitting: Internal Medicine

## 2013-09-11 DIAGNOSIS — F988 Other specified behavioral and emotional disorders with onset usually occurring in childhood and adolescence: Secondary | ICD-10-CM

## 2013-09-11 MED ORDER — AMPHETAMINE-DEXTROAMPHETAMINE 20 MG PO TABS: ORAL_TABLET | ORAL | Status: DC

## 2013-10-14 ENCOUNTER — Ambulatory Visit: Payer: Self-pay | Admitting: Emergency Medicine

## 2013-11-07 ENCOUNTER — Other Ambulatory Visit: Payer: Self-pay | Admitting: Internal Medicine

## 2013-11-07 DIAGNOSIS — F988 Other specified behavioral and emotional disorders with onset usually occurring in childhood and adolescence: Secondary | ICD-10-CM

## 2013-11-07 MED ORDER — AMPHETAMINE-DEXTROAMPHETAMINE 20 MG PO TABS: ORAL_TABLET | ORAL | Status: DC

## 2013-12-11 ENCOUNTER — Other Ambulatory Visit: Payer: Self-pay | Admitting: Internal Medicine

## 2013-12-11 DIAGNOSIS — F988 Other specified behavioral and emotional disorders with onset usually occurring in childhood and adolescence: Secondary | ICD-10-CM

## 2013-12-11 MED ORDER — AMPHETAMINE-DEXTROAMPHETAMINE 20 MG PO TABS: ORAL_TABLET | ORAL | Status: DC

## 2014-01-06 ENCOUNTER — Encounter: Payer: Self-pay | Admitting: Internal Medicine

## 2014-01-27 ENCOUNTER — Ambulatory Visit: Payer: Self-pay | Admitting: Internal Medicine

## 2014-01-30 ENCOUNTER — Other Ambulatory Visit: Payer: Self-pay | Admitting: Internal Medicine

## 2014-01-30 DIAGNOSIS — E782 Mixed hyperlipidemia: Secondary | ICD-10-CM

## 2014-01-30 DIAGNOSIS — F988 Other specified behavioral and emotional disorders with onset usually occurring in childhood and adolescence: Secondary | ICD-10-CM

## 2014-01-30 MED ORDER — AMPHETAMINE-DEXTROAMPHETAMINE 20 MG PO TABS: ORAL_TABLET | ORAL | Status: DC

## 2014-01-30 MED ORDER — ROSUVASTATIN CALCIUM 20 MG PO TABS: 20.0000 mg | ORAL_TABLET | Freq: Every day | ORAL | Status: DC

## 2014-03-06 ENCOUNTER — Other Ambulatory Visit: Payer: Self-pay | Admitting: Internal Medicine

## 2014-03-06 DIAGNOSIS — F988 Other specified behavioral and emotional disorders with onset usually occurring in childhood and adolescence: Secondary | ICD-10-CM

## 2014-03-06 MED ORDER — AMPHETAMINE-DEXTROAMPHETAMINE 20 MG PO TABS: ORAL_TABLET | ORAL | Status: DC

## 2014-03-14 ENCOUNTER — Ambulatory Visit: Payer: Self-pay | Admitting: Internal Medicine

## 2014-04-01 ENCOUNTER — Ambulatory Visit: Payer: Self-pay | Admitting: Internal Medicine

## 2014-04-09 ENCOUNTER — Other Ambulatory Visit: Payer: Self-pay | Admitting: Internal Medicine

## 2014-04-09 DIAGNOSIS — F988 Other specified behavioral and emotional disorders with onset usually occurring in childhood and adolescence: Secondary | ICD-10-CM

## 2014-04-09 MED ORDER — AMPHETAMINE-DEXTROAMPHETAMINE 20 MG PO TABS: ORAL_TABLET | ORAL | Status: DC

## 2014-04-17 ENCOUNTER — Ambulatory Visit (INDEPENDENT_AMBULATORY_CARE_PROVIDER_SITE_OTHER): Payer: No Typology Code available for payment source | Admitting: Internal Medicine

## 2014-04-17 ENCOUNTER — Encounter: Payer: Self-pay | Admitting: Internal Medicine

## 2014-04-17 VITALS — BP 116/80 | HR 68 | Temp 98.8°F | Resp 16 | Ht 71.0 in | Wt 240.0 lb

## 2014-04-17 DIAGNOSIS — R0989 Other specified symptoms and signs involving the circulatory and respiratory systems: Secondary | ICD-10-CM

## 2014-04-17 DIAGNOSIS — E559 Vitamin D deficiency, unspecified: Secondary | ICD-10-CM

## 2014-04-17 DIAGNOSIS — E785 Hyperlipidemia, unspecified: Secondary | ICD-10-CM

## 2014-04-17 DIAGNOSIS — F988 Other specified behavioral and emotional disorders with onset usually occurring in childhood and adolescence: Secondary | ICD-10-CM

## 2014-04-17 DIAGNOSIS — E349 Endocrine disorder, unspecified: Secondary | ICD-10-CM

## 2014-04-17 DIAGNOSIS — R7309 Other abnormal glucose: Secondary | ICD-10-CM

## 2014-04-17 DIAGNOSIS — I10 Essential (primary) hypertension: Secondary | ICD-10-CM

## 2014-04-17 DIAGNOSIS — Z79899 Other long term (current) drug therapy: Secondary | ICD-10-CM

## 2014-04-17 DIAGNOSIS — R7303 Prediabetes: Secondary | ICD-10-CM

## 2014-04-17 LAB — CBC WITH DIFFERENTIAL/PLATELET
Basophils Absolute: 0.1 10*3/uL (ref 0.0–0.1)
Basophils Relative: 1 % (ref 0–1)
Eosinophils Absolute: 0.2 10*3/uL (ref 0.0–0.7)
Eosinophils Relative: 3 % (ref 0–5)
HCT: 45.2 % (ref 39.0–52.0)
Hemoglobin: 16.1 g/dL (ref 13.0–17.0)
Lymphocytes Relative: 26 % (ref 12–46)
Lymphs Abs: 1.4 10*3/uL (ref 0.7–4.0)
MCH: 32.2 pg (ref 26.0–34.0)
MCHC: 35.6 g/dL (ref 30.0–36.0)
MCV: 90.4 fL (ref 78.0–100.0)
MPV: 9.2 fL (ref 8.6–12.4)
Monocytes Absolute: 0.5 10*3/uL (ref 0.1–1.0)
Monocytes Relative: 9 % (ref 3–12)
Neutro Abs: 3.2 10*3/uL (ref 1.7–7.7)
Neutrophils Relative %: 61 % (ref 43–77)
Platelets: 183 10*3/uL (ref 150–400)
RBC: 5 MIL/uL (ref 4.22–5.81)
RDW: 13.5 % (ref 11.5–15.5)
WBC: 5.2 10*3/uL (ref 4.0–10.5)

## 2014-04-17 LAB — HEMOGLOBIN A1C
Hgb A1c MFr Bld: 5.7 % — ABNORMAL HIGH (ref <5.7)
Mean Plasma Glucose: 117 mg/dL — ABNORMAL HIGH (ref <117)

## 2014-04-17 LAB — TESTOSTERONE: Testosterone: 242 ng/dL — ABNORMAL LOW (ref 300–890)

## 2014-04-17 NOTE — Progress Notes (Signed)
Patient ID: Joshua CasterRobert Curiale, male   DOB: Jan 17, 1961, 54 y.o.   MRN: [id number]   This very nice 54 y.o. MWM presents for 3 month follow up with Hypertension, Hyperlipidemia, Pre-Diabetes, ADD, Testosterone  and Vitamin D Deficiency.    Patient has hx/o labile HTN & has been monitored expectantly. BP has been controlled at home. Today's BP: 116/80 mmHg. Patient has had no complaints of any cardiac type chest pain, palpitations, dyspnea/orthopnea/PND, dizziness, claudication, or dependent edema.   Hyperlipidemia is controlled with diet & meds. Patient denies myalgias or other med SE's. Last Lipids were at goal - Total  Cholesterol, 131; HDL  27; LDL 78; Trig 130 on 07/09/2013.   Also, the patient has Morbid Obesity with BMI 33.49 & consequent  history of PreDiabetes with A1c 5.8% in 2011 and has had no symptoms of reactive hypoglycemia, diabetic polys, paresthesias or visual blurring.  Last A1c was  5.5% on  07/09/2013.   Patient has Testosterone deficiency predating since 2003 with a level of 178 - and he has been inconsistently on treatment with Depo Testerone and most recently Clomid which he had stopped. He does desire to restart treatment with injectable Testosterone. Further, the patient also has history of Vitamin D Deficiency of 16 in 2008 and supplements vitamin D without any suspected side-effects. Last vitamin D was 68 on 07/09/2013.     Medication List   clomiPHENE 50 MG tablet  - OFF  Commonly known as:  CLOMID -  Take 1 tablet (50 mg total) by mouth daily.     rosuvastatin 20 MG tablet  Commonly known as:  CRESTOR  Take 1 tablet (20 mg total) by mouth daily. For cholesterol     No Known Allergies  PMHx:   Past Medical History  Diagnosis Date  . Hyperlipidemia   . Labile hypertension   . ADD (attention deficit disorder)   . Vitamin D deficiency   . Other testicular hypofunction   . Prediabetes    Immunization History  Administered Date(s) Administered  . DT 09/20/2006  .  Pneumococcal-Unspecified 03/28/1998   Past Surgical History  Procedure Laterality Date  . Shoulder surgery Right 2006  . Vasectomy  1994  . Ankle surgery Right 1983   FHx:    Reviewed / unchanged  SHx:    Reviewed / unchanged  Systems Review:  Constitutional: Denies fever, chills, wt changes, headaches, insomnia, fatigue, night sweats, change in appetite. Eyes: Denies redness, blurred vision, diplopia, discharge, itchy, watery eyes.  ENT: Denies discharge, congestion, post nasal drip, epistaxis, sore throat, earache, hearing loss, dental pain, tinnitus, vertigo, sinus pain, snoring.  CV: Denies chest pain, palpitations, irregular heartbeat, syncope, dyspnea, diaphoresis, orthopnea, PND, claudication or edema. Respiratory: denies cough, dyspnea, DOE, pleurisy, hoarseness, laryngitis, wheezing.  Gastrointestinal: Denies dysphagia, odynophagia, heartburn, reflux, water brash, abdominal pain or cramps, nausea, vomiting, bloating, diarrhea, constipation, hematemesis, melena, hematochezia  or hemorrhoids. Genitourinary: Denies dysuria, frequency, urgency, nocturia, hesitancy, discharge, hematuria or flank pain. Musculoskeletal: Denies arthralgias, myalgias, stiffness, jt. swelling, pain, limping or strain/sprain.  Skin: Denies pruritus, rash, hives, warts, acne, eczema or change in skin lesion(s). Neuro: No weakness, tremor, incoordination, spasms, paresthesia or pain. Psychiatric: Denies confusion, memory loss or sensory loss. Endo: Denies change in weight, skin or hair change.  Heme/Lymph: No excessive bleeding, bruising or enlarged lymph nodes.  Physical Exam  BP 116/80   P 68  T98.8 F  R 16  Ht 5\' 11"    Wt 240 lb     BMI  33.49   Appears well nourished and in no distress. Eyes: PERRLA, EOMs, conjunctiva no swelling or erythema. Sinuses: No frontal/maxillary tenderness ENT/Mouth: EAC's clear, TM's nl w/o erythema, bulging. Nares clear w/o erythema, swelling, exudates. Oropharynx  clear without erythema or exudates. Oral hygiene is good. Tongue normal, non obstructing. Hearing intact.  Neck: Supple. Thyroid nl. Car 2+/2+ without bruits, nodes or JVD. Chest: Respirations nl with BS clear & equal w/o rales, rhonchi, wheezing or stridor.  Cor: Heart sounds normal w/ regular rate and rhythm without sig. murmurs, gallops, clicks, or rubs. Peripheral pulses normal and equal  without edema.  Abdomen: Soft & bowel sounds normal. Non-tender w/o guarding, rebound, hernias, masses, or organomegaly.  Lymphatics: Unremarkable.  Musculoskeletal: Full ROM all peripheral extremities, joint stability, 5/5 strength, and normal gait.  Skin: Warm, dry without exposed rashes, lesions or ecchymosis apparent.  Neuro: Cranial nerves intact, reflexes equal bilaterally. Sensory-motor testing grossly intact. Tendon reflexes grossly intact.  Pysch: Alert & oriented x 3.  Insight and judgement nl & appropriate. No ideations.  Assessment and Plan:  1. Hypertension - Continue monitor blood pressure at home. Continue diet/meds same.  2. Hyperlipidemia - Continue diet/meds, exercise,& lifestyle modifications. Continue monitor periodic cholesterol/liver & renal functions   3. Pre-Diabetes - Continue diet, exercise, lifestyle modifications. Monitor appropriate labs.  4. Vitamin D Deficiency - Continue supplementation.  5. Testosterone Deficiency - Rx to restart injections.   6. ADD - continue Adderall   Recommended regular exercise, BP monitoring, weight control, and discussed med and SE's. Recommended labs to assess and monitor clinical status. Further disposition pending results of labs.

## 2014-04-17 NOTE — Patient Instructions (Signed)

## 2014-04-18 LAB — HEPATIC FUNCTION PANEL
ALT: 92 U/L — AB (ref 0–53)
AST: 52 U/L — ABNORMAL HIGH (ref 0–37)
Albumin: 4.4 g/dL (ref 3.5–5.2)
Alkaline Phosphatase: 63 U/L (ref 39–117)
Bilirubin, Direct: 0.1 mg/dL (ref 0.0–0.3)
Indirect Bilirubin: 0.5 mg/dL (ref 0.2–1.2)
Total Bilirubin: 0.6 mg/dL (ref 0.2–1.2)
Total Protein: 7.3 g/dL (ref 6.0–8.3)

## 2014-04-18 LAB — VITAMIN D 25 HYDROXY (VIT D DEFICIENCY, FRACTURES): Vit D, 25-Hydroxy: 51 ng/mL (ref 30–100)

## 2014-04-18 LAB — BASIC METABOLIC PANEL WITH GFR
BUN: 16 mg/dL (ref 6–23)
CALCIUM: 9.3 mg/dL (ref 8.4–10.5)
CHLORIDE: 101 meq/L (ref 96–112)
CO2: 28 mEq/L (ref 19–32)
Creat: 0.84 mg/dL (ref 0.50–1.35)
GLUCOSE: 110 mg/dL — AB (ref 70–99)
Potassium: 4.2 mEq/L (ref 3.5–5.3)
SODIUM: 139 meq/L (ref 135–145)

## 2014-04-18 LAB — TSH: TSH: 2.186 u[IU]/mL (ref 0.350–4.500)

## 2014-04-18 LAB — INSULIN, FASTING: INSULIN FASTING, SERUM: 13 u[IU]/mL (ref 2.0–19.6)

## 2014-04-18 LAB — LIPID PANEL
Cholesterol: 174 mg/dL (ref 0–200)
HDL: 35 mg/dL — AB (ref >39)
LDL CALC: 106 mg/dL — AB (ref 0–99)
Total CHOL/HDL Ratio: 5 Ratio
Triglycerides: 163 mg/dL — ABNORMAL HIGH (ref <150)
VLDL: 33 mg/dL (ref 0–40)

## 2014-04-18 LAB — MAGNESIUM: MAGNESIUM: 2 mg/dL (ref 1.5–2.5)

## 2014-04-19 ENCOUNTER — Encounter: Payer: Self-pay | Admitting: Internal Medicine

## 2014-05-14 ENCOUNTER — Other Ambulatory Visit: Payer: Self-pay | Admitting: Internal Medicine

## 2014-05-14 DIAGNOSIS — F988 Other specified behavioral and emotional disorders with onset usually occurring in childhood and adolescence: Secondary | ICD-10-CM

## 2014-05-14 MED ORDER — AMPHETAMINE-DEXTROAMPHETAMINE 20 MG PO TABS: ORAL_TABLET | ORAL | Status: DC

## 2014-06-17 ENCOUNTER — Other Ambulatory Visit: Payer: Self-pay | Admitting: Internal Medicine

## 2014-06-17 ENCOUNTER — Telehealth: Payer: Self-pay

## 2014-06-17 DIAGNOSIS — F988 Other specified behavioral and emotional disorders with onset usually occurring in childhood and adolescence: Secondary | ICD-10-CM

## 2014-06-17 MED ORDER — AMPHETAMINE-DEXTROAMPHETAMINE 20 MG PO TABS: ORAL_TABLET | ORAL | Status: DC

## 2014-06-17 NOTE — Telephone Encounter (Signed)
Rx is ready for patient to pick up. Left message on machine to patient.

## 2014-07-24 ENCOUNTER — Encounter: Payer: Self-pay | Admitting: Internal Medicine

## 2014-07-24 ENCOUNTER — Ambulatory Visit (INDEPENDENT_AMBULATORY_CARE_PROVIDER_SITE_OTHER): Payer: No Typology Code available for payment source | Admitting: Internal Medicine

## 2014-07-24 ENCOUNTER — Other Ambulatory Visit: Payer: Self-pay | Admitting: Internal Medicine

## 2014-07-24 VITALS — BP 138/92 | HR 88 | Temp 97.5°F | Resp 16 | Ht 72.0 in | Wt 243.6 lb

## 2014-07-24 DIAGNOSIS — R5383 Other fatigue: Secondary | ICD-10-CM

## 2014-07-24 DIAGNOSIS — F988 Other specified behavioral and emotional disorders with onset usually occurring in childhood and adolescence: Secondary | ICD-10-CM

## 2014-07-24 DIAGNOSIS — Z111 Encounter for screening for respiratory tuberculosis: Secondary | ICD-10-CM

## 2014-07-24 DIAGNOSIS — E559 Vitamin D deficiency, unspecified: Secondary | ICD-10-CM

## 2014-07-24 DIAGNOSIS — R0989 Other specified symptoms and signs involving the circulatory and respiratory systems: Secondary | ICD-10-CM

## 2014-07-24 DIAGNOSIS — E349 Endocrine disorder, unspecified: Secondary | ICD-10-CM

## 2014-07-24 DIAGNOSIS — I10 Essential (primary) hypertension: Secondary | ICD-10-CM

## 2014-07-24 DIAGNOSIS — N529 Male erectile dysfunction, unspecified: Secondary | ICD-10-CM

## 2014-07-24 DIAGNOSIS — E785 Hyperlipidemia, unspecified: Secondary | ICD-10-CM

## 2014-07-24 DIAGNOSIS — Z125 Encounter for screening for malignant neoplasm of prostate: Secondary | ICD-10-CM

## 2014-07-24 DIAGNOSIS — Z1212 Encounter for screening for malignant neoplasm of rectum: Secondary | ICD-10-CM

## 2014-07-24 DIAGNOSIS — Z79899 Other long term (current) drug therapy: Secondary | ICD-10-CM

## 2014-07-24 DIAGNOSIS — R7303 Prediabetes: Secondary | ICD-10-CM

## 2014-07-24 LAB — HEPATIC FUNCTION PANEL
ALK PHOS: 65 U/L (ref 39–117)
ALT: 74 U/L — AB (ref 0–53)
AST: 45 U/L — ABNORMAL HIGH (ref 0–37)
Albumin: 4.2 g/dL (ref 3.5–5.2)
BILIRUBIN INDIRECT: 0.3 mg/dL (ref 0.2–1.2)
Bilirubin, Direct: 0.1 mg/dL (ref 0.0–0.3)
Total Bilirubin: 0.4 mg/dL (ref 0.2–1.2)
Total Protein: 7.2 g/dL (ref 6.0–8.3)

## 2014-07-24 LAB — LIPID PANEL
CHOL/HDL RATIO: 5.3 ratio
Cholesterol: 147 mg/dL (ref 0–200)
HDL: 28 mg/dL — ABNORMAL LOW (ref >=40)
LDL Cholesterol: 78 mg/dL (ref 0–99)
Triglycerides: 204 mg/dL — ABNORMAL HIGH (ref <150)
VLDL: 41 mg/dL — AB (ref 0–40)

## 2014-07-24 LAB — CBC WITH DIFFERENTIAL/PLATELET
Basophils Absolute: 0.1 10*3/uL (ref 0.0–0.1)
Basophils Relative: 1 % (ref 0–1)
EOS PCT: 4 % (ref 0–5)
Eosinophils Absolute: 0.2 10*3/uL (ref 0.0–0.7)
HCT: 52.1 % — ABNORMAL HIGH (ref 39.0–52.0)
Hemoglobin: 18.1 g/dL — ABNORMAL HIGH (ref 13.0–17.0)
Lymphocytes Relative: 28 % (ref 12–46)
Lymphs Abs: 1.6 10*3/uL (ref 0.7–4.0)
MCH: 31.6 pg (ref 26.0–34.0)
MCHC: 34.7 g/dL (ref 30.0–36.0)
MCV: 90.9 fL (ref 78.0–100.0)
MONO ABS: 0.6 10*3/uL (ref 0.1–1.0)
MONOS PCT: 10 % (ref 3–12)
MPV: 9.6 fL (ref 8.6–12.4)
NEUTROS ABS: 3.2 10*3/uL (ref 1.7–7.7)
Neutrophils Relative %: 57 % (ref 43–77)
PLATELETS: 166 10*3/uL (ref 150–400)
RBC: 5.73 MIL/uL (ref 4.22–5.81)
RDW: 13.6 % (ref 11.5–15.5)
WBC: 5.6 10*3/uL (ref 4.0–10.5)

## 2014-07-24 LAB — BASIC METABOLIC PANEL WITH GFR
BUN: 12 mg/dL (ref 6–23)
CO2: 25 meq/L (ref 19–32)
Calcium: 9.2 mg/dL (ref 8.4–10.5)
Chloride: 101 mEq/L (ref 96–112)
Creat: 0.95 mg/dL (ref 0.50–1.35)
GFR, Est African American: >89 mL/min
GFR, Est Non African American: >89 mL/min
GLUCOSE: 103 mg/dL — AB (ref 70–99)
POTASSIUM: 4.7 meq/L (ref 3.5–5.3)
Sodium: 138 mEq/L (ref 135–145)

## 2014-07-24 LAB — VITAMIN B12: Vitamin B-12: 363 pg/mL (ref 211–911)

## 2014-07-24 LAB — TSH: TSH: 3.205 u[IU]/mL (ref 0.350–4.500)

## 2014-07-24 LAB — IRON AND TIBC
%SAT: 30 % (ref 20–55)
IRON: 105 ug/dL (ref 42–165)
TIBC: 347 ug/dL (ref 215–435)
UIBC: 242 ug/dL (ref 125–400)

## 2014-07-24 LAB — MAGNESIUM: MAGNESIUM: 2 mg/dL (ref 1.5–2.5)

## 2014-07-24 MED ORDER — AMPHETAMINE-DEXTROAMPHETAMINE 20 MG PO TABS: ORAL_TABLET | ORAL | Status: DC

## 2014-07-24 MED ORDER — SILDENAFIL CITRATE 20 MG PO TABS: ORAL_TABLET | ORAL | Status: DC

## 2014-07-24 NOTE — Progress Notes (Signed)
Patient ID: Joshua Schroeder, male   DOB: Jul 19, 1960, 54 y.o.   MRN: [id number]   Annual Comprehensive Examination  This very nice 54 y.o. MWM presents for complete physical.  Patient has been followed for HTN, T2_NIDDM  Prediabetes, Hyperlipidemia, and Vitamin D Deficiency. Other problems include Adult ADD for which he's been on Adderall with improved concentration and functioning.   HTN predates since 2002. Patient's BP has been controlled at home.Today's BP: (!) 138/92 mmHg. Patient denies any cardiac symptoms as chest pain, palpitations, shortness of breath, dizziness or ankle swelling.   Patient's hyperlipidemia is controlled with diet and medications. Patient denies myalgias or other medication SE's. Last lipids were near goal  Total Chol 174; HDL 35; sl elevated LDL 106; Trig 163 on 04/17/2014:   Patient has prediabetes since Sept 2011 with an A1c 5.8% and patient denies reactive hypoglycemic symptoms, visual blurring, diabetic polys or paresthesias. Last A1c was 5.7% on 04/17/2014.   Patient has Low T and is on replacement with improved sense of well-being. Finally, patient has history of Vitamin D Deficiency of 16 in 2008 and last vitamin D was 51 on 04/17/2014.     Medication Sig  . amphetamine-dextroamphetamine (ADDERALL) 20 MG tablet Take 1/2 to 1 tablet 2 x daily as needed for ADD  . rosuvastatin (CRESTOR) 20 MG tablet Take 1 tablet (20 mg total) by mouth daily. For cholesterol   No Known Allergies   Past Medical History  Diagnosis Date  . Hyperlipidemia   . Labile hypertension   . ADD (attention deficit disorder)   . Vitamin D deficiency   . Other testicular hypofunction   . Prediabetes    Health Maintenance  Topic Date Due  . HIV Screening  09/21/1976  . TETANUS/TDAP  09/21/1980  . COLONOSCOPY  09/22/2011  . INFLUENZA VACCINE  10/27/2014   Immunization History  Administered Date(s) Administered  . DT 09/20/2006  . PPD Test 07/24/2014  . Pneumococcal-Unspecified  03/28/1998   Past Surgical History  Procedure Laterality Date  . Shoulder surgery Right 2006  . Vasectomy  1994  . Ankle surgery Right 1983   Family History  Problem Relation Age of Onset  . Hyperlipidemia Mother   . Hyperlipidemia Father    History   Social History  . Marital Status: Married    Spouse Name: N/A  . Number of Children: N/A  . Years of Education: N/A   Occupational History  . Not on file.   Social History Main Topics  . Smoking status: Former Games developer  . Smokeless tobacco: Never Used  . Alcohol Use: No     Comment: social  . Drug Use: No  . Sexual Activity: Active    ROS Constitutional: Denies fever, chills, weight loss/gain, headaches, insomnia,  night sweats or change in appetite. Does c/o fatigue. Eyes: Denies redness, blurred vision, diplopia, discharge, itchy or watery eyes.  ENT: Denies discharge, congestion, post nasal drip, epistaxis, sore throat, earache, hearing loss, dental pain, Tinnitus, Vertigo, Sinus pain or snoring.  Cardio: Denies chest pain, palpitations, irregular heartbeat, syncope, dyspnea, diaphoresis, orthopnea, PND, claudication or edema Respiratory: denies cough, dyspnea, DOE, pleurisy, hoarseness, laryngitis or wheezing.  Gastrointestinal: Denies dysphagia, heartburn, reflux, water brash, pain, cramps, nausea, vomiting, bloating, diarrhea, constipation, hematemesis, melena, hematochezia, jaundice or hemorrhoids Genitourinary: Denies dysuria, frequency, urgency, nocturia, hesitancy, discharge, hematuria or flank pain Musculoskeletal: Denies arthralgia, myalgia, stiffness, Jt. Swelling, pain, limp or strain/sprain. Denies Falls. Skin: Denies puritis, rash, hives, warts, acne, eczema or change in skin lesion  Neuro: No weakness, tremor, incoordination, spasms, paresthesia or pain Psychiatric: Denies confusion, memory loss or sensory loss. Denies Depression. Endocrine: Denies change in weight, skin, hair change, nocturia, and paresthesia,  diabetic polys, visual blurring or hyper / hypo glycemic episodes.  Heme/Lymph: No excessive bleeding, bruising or enlarged lymph nodes.  Physical Exam  BP 138/92   Pulse 88  Temp 97.5 F   Resp 16  Ht 6'  Wt 243 lb 9.6 oz     BMI 33.03   General Appearance: Well nourished, in no apparent distress. Eyes: PERRLA, EOMs, conjunctiva no swelling or erythema, normal fundi and vessels. Sinuses: No frontal/maxillary tenderness ENT/Mouth: EACs patent / TMs  nl. Nares clear without erythema, swelling, mucoid exudates. Oral hygiene is good. No erythema, swelling, or exudate. Tongue normal, non-obstructing. Tonsils not swollen or erythematous. Hearing normal.  Neck: Supple, thyroid normal. No bruits, nodes or JVD. Respiratory: Respiratory effort normal.  BS equal and clear bilateral without rales, rhonci, wheezing or stridor. Cardio: Heart sounds are normal with regular rate and rhythm and no murmurs, rubs or gallops. Peripheral pulses are normal and equal bilaterally without edema. No aortic or femoral bruits. Chest: symmetric with normal excursions and percussion.  Abdomen: Flat, soft, with bowl sounds. Nontender, no guarding, rebound, hernias, masses, or organomegaly.  Lymphatics: Non tender without lymphadenopathy.  Genitourinary: No hernias.Testes nl. DRE - prostate nl for age - smooth & firm w/o nodules. Musculoskeletal: Full ROM all peripheral extremities, joint stability, 5/5 strength, and normal gait. Skin: Warm and dry without rashes, lesions, cyanosis, clubbing or  ecchymosis.  Neuro: Cranial nerves intact, reflexes equal bilaterally. Normal muscle tone, no cerebellar symptoms. Sensation intact.  Pysch: Awake and oriented X 3 with normal affect, insight and judgment appropriate.   Assessment and Plan   1. Hypertension  - Microalbumin / creatinine urine ratio - EKG 12-Lead - US, RETROPERITNL ABD,  LTD  2. Hyperlipidemia  - Lipid panel  3. Prediabetes  - Hemoglobin A1c -  Insulin, random  4. Vitamin D deficiency  - Vit D  25 hydroxy (rtn osteoporosis monitoring)  5. Testosterone deficiency  - Testosterone  6. ADD (attention deficit disorder)   7. Prostate cancer screening  - PSA  8. Screening for rectal cancer  - POC Hemoccult Bld/Stl (3-Cd Home Screen); Future  9. Medication management  - Urine Microscopic - CBC with Differential/Platelet - BASIC METABOLIC PANEL WITH GFR - Hepatic function panel - Magnesium  10. Other fatigue  - Vitamin B12 - Iron and TIBC - TSH  - At end of visit , patient did ask for Rx Viagra & was given Rx- Sildenafil 20 mg #90 to take 1-5 tyabs qd prn   Continue prudent diet as discussed, weight control, BP monitoring, regular exercise, and medications as discussed.  Discussed med effects and SE's. Routine screening labs and tests as requested with regular follow-up as recommended. Over 40 minutes of exam, counseling &  chart review was performed

## 2014-07-24 NOTE — Patient Instructions (Signed)
++++++++++++++++++++++++++++++++++  Recommend Low dose or baby Aspirin 81 mg daily   To reduce risk of Colon Cancer 20 %, Skin Cancer 26 % , Melanoma 46% and   Pancreatic cancer 60%  +++++++++++++++++++++++++++++++++ Vitamin D goal is between 70-100.   Please make sure that you are taking your Vitamin D as directed.   It is very important as a natural antiinflammatory   helping hair, skin, and nails, as well as reducing stroke and heart attack risk.   It helps your bones and helps with mood.  It also decreases numerous cancer risks so please take it as directed.   Low Vit D is associated Recommend the book "The END of DIETING" by Dr Monico HoarJoel Fuhrman   & the book "The END of DIABETES " by Dr Monico HoarJoel Fuhrman  At [email or web address] - get book & Audio CD's     Being diabetic has a  300% increased risk for heart attack, stroke, cancer, and alzheimer- type vascular dementia. It is very important that you work harder with diet by avoiding all foods that are white. Avoid white rice (brown & wild rice is OK), white potatoes (sweetpotatoes in moderation is OK), White bread or wheat bread or anything made out of white flour like bagels, donuts, rolls, buns, biscuits, cakes, pastries, cookies, pizza crust, and pasta (made from white flour & egg whites) - vegetarian pasta or spinach or wheat pasta is OK. Multigrain breads like Arnold's or Pepper[employer], or multigrain sandwich thins or flatbreads.  Diet, exercise and weight loss can reverse and cure diabetes in the early stages.  Diet, exercise and weight loss is very important in the control and prevention of complications of diabetes which affects every system in your body, ie. Brain - dementia/stroke, eyes - glaucoma/blindness, heart - heart attack/heart failure, kidneys - dialysis, stomach - gastric paralysis, intestines - malabsorption, nerves - severe painful neuritis, circulation - gangrene & loss of a leg(s), and finally cancer and Alzheimers.    I  recommend avoid fried & greasy foods,  sweets/candy, white rice (brown or wild rice or Quinoa is OK), white potatoes (sweet potatoes are OK) - anything made from white flour - bagels, doughnuts, rolls, buns, biscuits,white and wheat breads, pizza crust and traditional pasta made of white flour & egg white(vegetarian pasta or spinach or wheat pasta is OK).  Multi-grain bread is OK - like multi-grain flat bread or sandwich thins. Avoid alcohol in excess. Exercise is also important.    Eat all the vegetables you want - avoid meat, especially red meat and dairy - especially cheese.  Cheese is the most concentrated form of trans-fats which is the worst thing to clog up our arteries. Veggie cheese is OK which can be found in the fresh produce section at [employer] or [employer] or [employer]  ++++++++++++++++++++++++++++++++++++++++++++++++++++++++ with a 200-300% higher risk for CANCER   and 200-300% higher risk for HEART   ATTACK  &  STROKE.    ..................................................................................Marland Kitchen.  It is also associated with higher death rate at younger ages,   autoimmune diseases like Rheumatoid arthritis, Lupus, Multiple Sclerosis.     Also many other serious conditions, like depression, Alzheimer's  Dementia, infertility, muscle aches, fatigue, fibromyalgia - just to name a few.  ++++++++++++++++++++++++++++++++++++++++ Preventive Care for Adults  A healthy lifestyle and preventive care can promote health and wellness. Preventive health guidelines for men include the following key practices:  A routine yearly physical is a good way to check with your health care provider about  your health and preventative screening. It is a chance to share any concerns and updates on your health and to receive a thorough exam.  Visit your dentist for a routine exam and preventative care every 6 months. Brush your teeth twice a day and floss once a day. Good oral hygiene  prevents tooth decay and gum disease.  The frequency of eye exams is based on your age, health, family medical history, use of contact lenses, and other factors. Follow your health care provider's recommendations for frequency of eye exams.  Eat a healthy diet. Foods such as vegetables, fruits, whole grains, low-fat dairy products, and lean protein foods contain the nutrients you need without too many calories. Decrease your intake of foods high in solid fats, added sugars, and salt. Eat the right amount of calories for you.Get information about a proper diet from your health care provider, if necessary.  Regular physical exercise is one of the most important things you can do for your health. Most adults should get at least 150 minutes of moderate-intensity exercise (any activity that increases your heart rate and causes you to sweat) each week. In addition, most adults need muscle-strengthening exercises on 2 or more days a week.  Maintain a healthy weight. The body mass index (BMI) is a screening tool to identify possible weight problems. It provides an estimate of body fat based on height and weight. Your health care provider can find your BMI and can help you achieve or maintain a healthy weight.For adults 20 years and older:  A BMI below 18.5 is considered underweight.  A BMI of 18.5 to 24.9 is normal.  A BMI of 25 to 29.9 is considered overweight.  A BMI of 30 and above is considered obese.  Maintain normal blood lipids and cholesterol levels by exercising and minimizing your intake of saturated fat. Eat a balanced diet with plenty of fruit and vegetables. Blood tests for lipids and cholesterol should begin at age 20 and be repeated every 5 years. If your lipid or cholesterol levels are high, you are over 50, or you are at high risk for heart disease, you may need your cholesterol levels checked more frequently.Ongoing high lipid and cholesterol levels should be treated with medicines if  diet and exercise are not working.  If you smoke, find out from your health care provider how to quit. If you do not use tobacco, do not start.  Lung cancer screening is recommended for adults aged 55-80 years who are at high risk for developing lung cancer because of a history of smoking. A yearly low-dose CT scan of the lungs is recommended for people who have at least a 30-pack-year history of smoking and are a current smoker or have quit within the past 15 years. A pack year of smoking is smoking an average of 1 pack of cigarettes a day for 1 year (for example: 1 pack a day for 30 years or 2 packs a day for 15 years). Yearly screening should continue until the smoker has stopped smoking for at least 15 years. Yearly screening should be stopped for people who develop a health problem that would prevent them from having lung cancer treatment.  If you choose to drink alcohol, do not have more than 2 drinks per day. One drink is considered to be 12 ounces (355 mL) of beer, 5 ounces (148 mL) of wine, or 1.5 ounces (44 mL) of liquor.  Avoid use of street drugs. Do not share needles with anyone.   Ask for help if you need support or instructions about stopping the use of drugs.  High blood pressure causes heart disease and increases the risk of stroke. Your blood pressure should be checked at least every 1-2 years. Ongoing high blood pressure should be treated with medicines, if weight loss and exercise are not effective.  If you are 81-66 years old, ask your health care provider if you should take aspirin to prevent heart disease.  Diabetes screening involves taking a blood sample to check your fasting blood sugar level. This should be done once every 3 years, after age 71, if you are within normal weight and without risk factors for diabetes. Testing should be considered at a younger age or be carried out more frequently if you are overweight and have at least 1 risk factor for diabetes.  Colorectal  cancer can be detected and often prevented. Most routine colorectal cancer screening begins at the age of 2 and continues through age 33. However, your health care provider may recommend screening at an earlier age if you have risk factors for colon cancer. On a yearly basis, your health care provider may provide home test kits to check for hidden blood in the stool. Use of a small camera at the end of a tube to directly examine the colon (sigmoidoscopy or colonoscopy) can detect the earliest forms of colorectal cancer. Talk to your health care provider about this at age 42, when routine screening begins. Direct exam of the colon should be repeated every 5-10 years through age 40, unless early forms of precancerous polyps or small growths are found.   Talk with your health care provider about prostate cancer screening.  Testicular cancer screening isrecommended for adult males. Screening includes self-exam, a health care provider exam, and other screening tests. Consult with your health care provider about any symptoms you have or any concerns you have about testicular cancer.  Use sunscreen. Apply sunscreen liberally and repeatedly throughout the day. You should seek shade when your shadow is shorter than you. Protect yourself by wearing long sleeves, pants, a wide-brimmed hat, and sunglasses year round, whenever you are outdoors.  Once a month, do a whole-body skin exam, using a mirror to look at the skin on your back. Tell your health care provider about new moles, moles that have irregular borders, moles that are larger than a pencil eraser, or moles that have changed in shape or color.  Stay current with required vaccines (immunizations).  Influenza vaccine. All adults should be immunized every year.  Tetanus, diphtheria, and acellular pertussis (Td, Tdap) vaccine. An adult who has not previously received Tdap or who does not know his vaccine status should receive 1 dose of Tdap. This initial  dose should be followed by tetanus and diphtheria toxoids (Td) booster doses every 10 years. Adults with an unknown or incomplete history of completing a 3-dose immunization series with Td-containing vaccines should begin or complete a primary immunization series including a Tdap dose. Adults should receive a Td booster every 10 years.  Varicella vaccine. An adult without evidence of immunity to varicella should receive 2 doses or a second dose if he has previously received 1 dose.  Human papillomavirus (HPV) vaccine. Males aged 83-21 years who have not received the vaccine previously should receive the 3-dose series. Males aged 22-26 years may be immunized. Immunization is recommended through the age of 40 years for any male who has sex with males and did not get any or all doses earlier.  Immunization is recommended for any person with an immunocompromised condition through the age of 26 years if he did not get any or all doses earlier. During the 3-dose series, the second dose should be obtained 4-8 weeks after the first dose. The third dose should be obtained 24 weeks after the first dose and 16 weeks after the second dose.  Zoster vaccine. One dose is recommended for adults aged 60 years or older unless certain conditions are present.    PREVNAR  - Pneumococcal 13-valent conjugate (PCV13) vaccine. When indicated, a person who is uncertain of his immunization history and has no record of immunization should receive the PCV13 vaccine. An adult aged 19 years or older who has certain medical conditions and has not been previously immunized should receive 1 dose of PCV13 vaccine. This PCV13 should be followed with a dose of pneumococcal polysaccharide (PPSV23) vaccine. The PPSV23 vaccine dose should be obtained at least 8 weeks after the dose of PCV13 vaccine. An adult aged 19 years or older who has certain medical conditions and previously received 1 or more doses of PPSV23 vaccine should receive 1 dose of  PCV13. The PCV13 vaccine dose should be obtained 1 or more years after the last PPSV23 vaccine dose.    PNEUMOVAX - Pneumococcal polysaccharide (PPSV23) vaccine. When PCV13 is also indicated, PCV13 should be obtained first. All adults aged 65 years and older should be immunized. An adult younger than age 65 years who has certain medical conditions should be immunized. Any person who resides in a nursing home or long-term care facility should be immunized. An adult smoker should be immunized. People with an immunocompromised condition and certain other conditions should receive both PCV13 and PPSV23 vaccines. People with human immunodeficiency virus (HIV) infection should be immunized as soon as possible after diagnosis. Immunization during chemotherapy or radiation therapy should be avoided. Routine use of PPSV23 vaccine is not recommended for American Indians, [place] Natives, or people younger than 65 years unless there are medical conditions that require PPSV23 vaccine. When indicated, people who have unknown immunization and have no record of immunization should receive PPSV23 vaccine. One-time revaccination 5 years after the first dose of PPSV23 is recommended for people aged 19-64 years who have chronic kidney failure, nephrotic syndrome, asplenia, or immunocompromised conditions. People who received 1-2 doses of PPSV23 before age 65 years should receive another dose of PPSV23 vaccine at age 65 years or later if at least 5 years have passed since the previous dose. Doses of PPSV23 are not needed for people immunized with PPSV23 at or after age 65 years.    Hepatitis A vaccine. Adults who wish to be protected from this disease, have certain high-risk conditions, work with hepatitis A-infected animals, work in hepatitis A research labs, or travel to or work in countries with a high rate of hepatitis A should be immunized. Adults who were previously unvaccinated and who anticipate close contact with an  international adoptee during the first 60 days after arrival in the [place] from a country with a high rate of hepatitis A should be immunized.    Hepatitis B vaccine. Adults should be immunized if they wish to be protected from this disease, have certain high-risk conditions, may be exposed to blood or other infectious body fluids, are household contacts or sex partners of hepatitis B positive people, are clients or workers in certain care facilities, or travel to or work in countries with a high rate of hepatitis B.   Preventive   Service / Frequency   Ages 61 to 78  Blood pressure check.  Lipid and cholesterol check  Lung cancer screening. / Every year if you are aged 85-80 years and have a 30-pack-year history of smoking and currently smoke or have quit within the past 15 years. Yearly screening is stopped once you have quit smoking for at least 15 years or develop a health problem that would prevent you from having lung cancer treatment.  Fecal occult blood test (FOBT) of stool. / Every year beginning at age 87 and continuing until age 34. You may not have to do this test if you get a colonoscopy every 10 years.  Flexible sigmoidoscopy** or colonoscopy.** / Every 5 years for a flexible sigmoidoscopy or every 10 years for a colonoscopy beginning at age 7 and continuing until age 27. Screening for abdominal aortic aneurysm (AAA)  by ultrasound is recommended for people who have history of high blood pressure or who are current or former smokers.

## 2014-07-25 LAB — HEMOGLOBIN A1C
HEMOGLOBIN A1C: 5.9 % — AB (ref <5.7)
Mean Plasma Glucose: 123 mg/dL — ABNORMAL HIGH (ref <117)

## 2014-07-25 LAB — MICROALBUMIN / CREATININE URINE RATIO
CREATININE, URINE: 144 mg/dL
MICROALB/CREAT RATIO: 75 mg/g — AB (ref 0.0–30.0)
Microalb, Ur: 10.8 mg/dL — ABNORMAL HIGH (ref <2.0)

## 2014-07-25 LAB — INSULIN, RANDOM: Insulin: 21 u[IU]/mL — ABNORMAL HIGH (ref 2.0–19.6)

## 2014-07-25 LAB — PSA: PSA: 0.65 ng/mL (ref <=4.00)

## 2014-07-25 LAB — URINALYSIS, MICROSCOPIC ONLY
CASTS: NONE SEEN
Crystals: NONE SEEN
Squamous Epithelial / LPF: NONE SEEN

## 2014-07-25 LAB — TESTOSTERONE: TESTOSTERONE: 817 ng/dL (ref 300–890)

## 2014-07-25 LAB — VITAMIN D 25 HYDROXY (VIT D DEFICIENCY, FRACTURES): Vit D, 25-Hydroxy: 38 ng/mL (ref 30–100)

## 2014-09-01 ENCOUNTER — Other Ambulatory Visit: Payer: Self-pay | Admitting: Internal Medicine

## 2014-09-01 DIAGNOSIS — F988 Other specified behavioral and emotional disorders with onset usually occurring in childhood and adolescence: Secondary | ICD-10-CM

## 2014-09-01 MED ORDER — AMPHETAMINE-DEXTROAMPHETAMINE 20 MG PO TABS: ORAL_TABLET | ORAL | Status: DC

## 2014-09-15 ENCOUNTER — Other Ambulatory Visit: Payer: Self-pay | Admitting: *Deleted

## 2014-09-15 DIAGNOSIS — N529 Male erectile dysfunction, unspecified: Secondary | ICD-10-CM

## 2014-09-15 MED ORDER — SILDENAFIL CITRATE 20 MG PO TABS: ORAL_TABLET | ORAL | Status: DC

## 2014-10-15 ENCOUNTER — Other Ambulatory Visit: Payer: Self-pay | Admitting: *Deleted

## 2014-10-15 DIAGNOSIS — F988 Other specified behavioral and emotional disorders with onset usually occurring in childhood and adolescence: Secondary | ICD-10-CM

## 2014-10-15 MED ORDER — AMPHETAMINE-DEXTROAMPHETAMINE 20 MG PO TABS: ORAL_TABLET | ORAL | Status: DC

## 2014-11-10 ENCOUNTER — Ambulatory Visit: Payer: Self-pay | Admitting: Internal Medicine

## 2014-11-26 ENCOUNTER — Other Ambulatory Visit: Payer: Self-pay | Admitting: Internal Medicine

## 2014-11-26 DIAGNOSIS — F988 Other specified behavioral and emotional disorders with onset usually occurring in childhood and adolescence: Secondary | ICD-10-CM

## 2014-11-26 MED ORDER — AMPHETAMINE-DEXTROAMPHETAMINE 20 MG PO TABS: ORAL_TABLET | ORAL | Status: DC

## 2014-12-29 ENCOUNTER — Other Ambulatory Visit: Payer: Self-pay | Admitting: *Deleted

## 2014-12-29 DIAGNOSIS — F988 Other specified behavioral and emotional disorders with onset usually occurring in childhood and adolescence: Secondary | ICD-10-CM

## 2014-12-29 MED ORDER — AMPHETAMINE-DEXTROAMPHETAMINE 20 MG PO TABS: ORAL_TABLET | ORAL | Status: DC

## 2015-01-12 ENCOUNTER — Encounter: Payer: Self-pay | Admitting: Internal Medicine

## 2015-02-06 ENCOUNTER — Encounter: Payer: Self-pay | Admitting: Physician Assistant

## 2015-02-06 ENCOUNTER — Ambulatory Visit (INDEPENDENT_AMBULATORY_CARE_PROVIDER_SITE_OTHER): Payer: Managed Care, Other (non HMO) | Admitting: Physician Assistant

## 2015-02-06 VITALS — BP 138/80 | HR 67 | Temp 98.1°F | Resp 14 | Ht 72.0 in | Wt 242.0 lb

## 2015-02-06 DIAGNOSIS — F909 Attention-deficit hyperactivity disorder, unspecified type: Secondary | ICD-10-CM | POA: Diagnosis not present

## 2015-02-06 DIAGNOSIS — R7303 Prediabetes: Secondary | ICD-10-CM

## 2015-02-06 DIAGNOSIS — Z79899 Other long term (current) drug therapy: Secondary | ICD-10-CM

## 2015-02-06 DIAGNOSIS — E559 Vitamin D deficiency, unspecified: Secondary | ICD-10-CM | POA: Diagnosis not present

## 2015-02-06 DIAGNOSIS — F988 Other specified behavioral and emotional disorders with onset usually occurring in childhood and adolescence: Secondary | ICD-10-CM

## 2015-02-06 DIAGNOSIS — R0989 Other specified symptoms and signs involving the circulatory and respiratory systems: Secondary | ICD-10-CM

## 2015-02-06 DIAGNOSIS — E785 Hyperlipidemia, unspecified: Secondary | ICD-10-CM | POA: Diagnosis not present

## 2015-02-06 DIAGNOSIS — I10 Essential (primary) hypertension: Secondary | ICD-10-CM

## 2015-02-06 DIAGNOSIS — E291 Testicular hypofunction: Secondary | ICD-10-CM | POA: Diagnosis not present

## 2015-02-06 DIAGNOSIS — E349 Endocrine disorder, unspecified: Secondary | ICD-10-CM

## 2015-02-06 LAB — HEPATIC FUNCTION PANEL
ALK PHOS: 57 U/L (ref 40–115)
ALT: 63 U/L — AB (ref 9–46)
AST: 40 U/L — AB (ref 10–35)
Albumin: 4.3 g/dL (ref 3.6–5.1)
BILIRUBIN DIRECT: 0.1 mg/dL (ref <=0.2)
BILIRUBIN INDIRECT: 0.6 mg/dL (ref 0.2–1.2)
BILIRUBIN TOTAL: 0.7 mg/dL (ref 0.2–1.2)
Total Protein: 7 g/dL (ref 6.1–8.1)

## 2015-02-06 LAB — CBC WITH DIFFERENTIAL/PLATELET
Basophils Absolute: 0 10*3/uL (ref 0.0–0.1)
Basophils Relative: 1 % (ref 0–1)
Eosinophils Absolute: 0.2 10*3/uL (ref 0.0–0.7)
Eosinophils Relative: 4 % (ref 0–5)
HCT: 47.8 % (ref 39.0–52.0)
HEMOGLOBIN: 16.8 g/dL (ref 13.0–17.0)
LYMPHS ABS: 1.6 10*3/uL (ref 0.7–4.0)
LYMPHS PCT: 35 % (ref 12–46)
MCH: 31.6 pg (ref 26.0–34.0)
MCHC: 35.1 g/dL (ref 30.0–36.0)
MCV: 89.8 fL (ref 78.0–100.0)
MONOS PCT: 7 % (ref 3–12)
MPV: 9.3 fL (ref 8.6–12.4)
Monocytes Absolute: 0.3 10*3/uL (ref 0.1–1.0)
NEUTROS ABS: 2.4 10*3/uL (ref 1.7–7.7)
NEUTROS PCT: 53 % (ref 43–77)
Platelets: 174 10*3/uL (ref 150–400)
RBC: 5.32 MIL/uL (ref 4.22–5.81)
RDW: 13 % (ref 11.5–15.5)
WBC: 4.5 10*3/uL (ref 4.0–10.5)

## 2015-02-06 LAB — LIPID PANEL
CHOL/HDL RATIO: 6.7 ratio — AB (ref <=5.0)
Cholesterol: 208 mg/dL — ABNORMAL HIGH (ref 125–200)
HDL: 31 mg/dL — AB (ref >=40)
LDL Cholesterol: 126 mg/dL (ref <130)
Triglycerides: 257 mg/dL — ABNORMAL HIGH (ref <150)
VLDL: 51 mg/dL — AB (ref <30)

## 2015-02-06 LAB — TSH: TSH: 2.206 u[IU]/mL (ref 0.350–4.500)

## 2015-02-06 LAB — BASIC METABOLIC PANEL WITH GFR
BUN: 19 mg/dL (ref 7–25)
CHLORIDE: 101 mmol/L (ref 98–110)
CO2: 24 mmol/L (ref 20–31)
Calcium: 9.2 mg/dL (ref 8.6–10.3)
Creat: 0.91 mg/dL (ref 0.70–1.33)
GFR, Est African American: >89 mL/min (ref >=60)
Glucose, Bld: 105 mg/dL — ABNORMAL HIGH (ref 65–99)
POTASSIUM: 4.3 mmol/L (ref 3.5–5.3)
SODIUM: 137 mmol/L (ref 135–146)

## 2015-02-06 LAB — MAGNESIUM: Magnesium: 1.9 mg/dL (ref 1.5–2.5)

## 2015-02-06 LAB — HEMOGLOBIN A1C
HEMOGLOBIN A1C: 5.7 % — AB (ref <5.7)
MEAN PLASMA GLUCOSE: 117 mg/dL — AB (ref <117)

## 2015-02-06 MED ORDER — TESTOSTERONE CYPIONATE 200 MG/ML IM SOLN: 200.0000 mg | INTRAMUSCULAR | Status: DC

## 2015-02-06 MED ORDER — AMPHETAMINE-DEXTROAMPHETAMINE 20 MG PO TABS: ORAL_TABLET | ORAL | Status: DC

## 2015-02-06 MED ORDER — "NEEDLE (DISP) 21G X 1"" MISC": Status: AC

## 2015-02-06 NOTE — Progress Notes (Signed)
Assessment and Plan:  1. Hypertension -Continue medication, monitor blood pressure at home. Continue DASH diet.  Reminder to go to the ER if any CP, SOB, nausea, dizziness, severe HA, changes vision/speech, left arm numbness and tingling and jaw pain.  2. Cholesterol -Continue diet and exercise. Check cholesterol. Has been off crestor, on metamucil  3. Prediabetes  -Continue diet and exercise. Check A1C  4. Vitamin D Def - check level and continue medications.   5. ADD -  Continue ADD medication, helps with focus, no AE's. The patient was counseled on the addictive nature of the medication and was encouraged to take drug holidays when not needed.   6. Hypogonadism - continue replacement therapy, check testosterone levels as needed.    Continue diet and meds as discussed. Further disposition pending results of labs. Over 30 minutes of exam, counseling, chart review, and critical decision making was performed  HPI 54 y.o. male  presents for 3 month follow up on hypertension, cholesterol, prediabetes, and vitamin D deficiency.   His blood pressure has been controlled at home, today their BP is BP: 138/80 mmHg  He does workout. He denies chest pain, shortness of breath, dizziness.  He is not on cholesterol medication, has stopped, he was on crestor  5 days a week but has not been on it for past 6 months, he is on metamucil. His cholesterol is at goal. The cholesterol last visit was:   Lab Results  Component Value Date   CHOL 147 07/24/2014   HDL 28* 07/24/2014   LDLCALC 78 07/24/2014   TRIG 204* 07/24/2014   CHOLHDL 5.3 07/24/2014    He has been working on diet and exercise for prediabetes, and denies paresthesia of the feet, polydipsia, polyuria and visual disturbances. Last A1C in the office was:  Lab Results  Component Value Date   HGBA1C 5.9* 07/24/2014   Patient is on Vitamin D supplement.   Lab Results  Component Value Date   VD25OH 79 07/24/2014     He has a  history of testosterone deficiency and is on testosterone replacement but has been off for several months. He would like to get back on it, he states that since off of it he has had less energy.  Lab Results  Component Value Date   TESTOSTERONE 817 07/24/2014   He is on Adderall, he will rarely take two a day.   Current Medications:  Current Outpatient Prescriptions on File Prior to Visit  Medication Sig Dispense Refill  . OVER THE COUNTER MEDICATION Metamucil powder at bedtime.    . rosuvastatin (CRESTOR) 20 MG tablet Take 1 tablet (20 mg total) by mouth daily. For cholesterol 30 tablet 99  . sildenafil (REVATIO) 20 MG tablet Take 1-5 tabs qd prn XXXX 90 tablet 99  . testosterone cypionate (DEPOTESTOTERONE CYPIONATE) 200 MG/ML injection     . amphetamine-dextroamphetamine (ADDERALL) 20 MG tablet Take 1/2 to 1 tablet 2 x daily as needed for ADD 60 tablet 0   No current facility-administered medications on file prior to visit.   Medical History:  Past Medical History  Diagnosis Date  . Hyperlipidemia   . Labile hypertension   . ADD (attention deficit disorder)   . Vitamin D deficiency   . Other testicular hypofunction   . Prediabetes    Allergies: No Known Allergies   Review of Systems:  Review of Systems  Constitutional: Negative.   HENT: Negative.   Eyes: Negative.   Respiratory: Negative.   Cardiovascular: Negative.  Gastrointestinal: Negative.   Genitourinary: Negative.   Musculoskeletal: Negative.   Skin: Negative.   Neurological: Negative.   Endo/Heme/Allergies: Negative.   Psychiatric/Behavioral: Negative.     Family history- Review and unchanged Social history- Review and unchanged Physical Exam: BP 138/80 mmHg  Pulse 67  Temp(Src) 98.1 F (36.7 C) (Temporal)  Resp 14  Ht 6' (1.829 m)  Wt 242 lb (109.77 kg)  BMI 32.81 kg/m2  SpO2 98% Wt Readings from Last 3 Encounters:  02/06/15 242 lb (109.77 kg)  07/24/14 243 lb 9.6 oz (110.496 kg)  04/17/14 240  lb (108.863 kg)   General Appearance: Well nourished, in no apparent distress. Eyes: PERRLA, EOMs, conjunctiva no swelling or erythema Sinuses: No Frontal/maxillary tenderness ENT/Mouth: Ext aud canals clear, TMs without erythema, bulging. No erythema, swelling, or exudate on post pharynx.  Tonsils not swollen or erythematous. Hearing normal.  Neck: Supple, thyroid normal.  Respiratory: Respiratory effort normal, BS equal bilaterally without rales, rhonchi, wheezing or stridor.  Cardio: RRR with no MRGs. Brisk peripheral pulses without edema.  Abdomen: Soft, + BS,  Non tender, no guarding, rebound, hernias, masses. Lymphatics: Non tender without lymphadenopathy.  Musculoskeletal: Full ROM, 5/5 strength, Normal gait Skin: Warm, dry without rashes, lesions, ecchymosis.  Neuro: Cranial nerves intact. Normal muscle tone, no cerebellar symptoms. Psych: Awake and oriented X 3, normal affect, Insight and Judgment appropriate.    Quentin MullingAmanda Dann Galicia, PA-C 9:38 AM [facility]

## 2015-02-06 NOTE — Patient Instructions (Signed)
Your LDL is not in range, 07/24/2014: LDL Cholesterol 78. Your LDL is the bad cholesterol that can lead to heart attack and stroke. To lower your number you can decrease your fatty foods, red meat, cheese, milk and increase fiber like whole grains and veggies. You can also add a fiber supplement like Metamucil or Benefiber.   Benefiber is good for constipation/diarrhea/irritable bowel syndrome, it helps with weight loss and can help lower your bad cholesterol. Please do 1-2 TBSP in the morning in water, coffee, or tea. It can take up to a month before you can see a difference with your bowel movements. It is cheapest from [employer], [employer], [employer].   9 Ways to Naturally Increase Testosterone Levels  1.   Lose Weight If you're overweight, shedding the excess pounds may increase your testosterone levels, according to research presented at the Endocrine Society's 2012 meeting. Overweight men are more likely to have low testosterone levels to begin with, so this is an important trick to increase your body's testosterone production when you need it most.  2.   High-Intensity Exercise like Peak Fitness  Short intense exercise has a proven positive effect on increasing testosterone levels and preventing its decline. That's unlike aerobics or prolonged moderate exercise, which have shown to have negative or no effect on testosterone levels. Having a whey protein meal after exercise can further enhance the satiety/testosterone-boosting impact (hunger hormones cause the opposite effect on your testosterone and libido). Here's a summary of what a typical high-intensity Peak Fitness routine might look like: " Warm up for three minutes  " Exercise as hard and fast as you can for 30 seconds. You should feel like you couldn't possibly go on another few seconds  " Recover at a slow to moderate pace for 90 seconds  " Repeat the high intensity exercise and recovery 7 more times .  3.   Consume Plenty of Zinc The mineral  zinc is important for testosterone production, and supplementing your diet for as little as six weeks has been shown to cause a marked improvement in testosterone among men with low levels.1 Likewise, research has shown that restricting dietary sources of zinc leads to a significant decrease in testosterone, while zinc supplementation increases it2 -- and even protects men from exercised-induced reductions in testosterone levels.3 It's estimated that up to 45 percent of adults over the age of 60 may have lower than recommended zinc intakes; even when dietary supplements were added in, an estimated 20-25 percent of older adults still had inadequate zinc intakes, according to a [employer] and Nutrition Examination Survey.4 Your diet is the best source of zinc; along with protein-rich foods like meats and fish, other good dietary sources of zinc include raw milk, raw cheese, beans, and yogurt or kefir made from raw milk. It can be difficult to obtain enough dietary zinc if you're a vegetarian, and also for meat-eaters as well, largely because of conventional farming methods that rely heavily on chemical fertilizers and pesticides. These chemicals deplete the soil of nutrients ... nutrients like zinc that must be absorbed by plants in order to be passed on to you. In many cases, you may further deplete the nutrients in your food by the way you prepare it. For most food, cooking it will drastically reduce its levels of nutrients like zinc . particularly over-cooking, which many people do. If you decide to use a zinc supplement, stick to a dosage of less than 40 mg a day, as this is the  recommended adult upper limit. Taking too much zinc can interfere with your body's ability to absorb other minerals, especially copper, and may cause nausea as a side effect.  4.   Strength Training In addition to Peak Fitness, strength training is also known to boost testosterone levels, provided you are doing so intensely  enough. When strength training to boost testosterone, you'll want to increase the weight and lower your number of reps, and then focus on exercises that work a large number of muscles, such as dead lifts or squats.  You can "turbo-charge" your weight training by going slower. By slowing down your movement, you're actually turning it into a high-intensity exercise. Super Slow movement allows your muscle, at the microscopic level, to access the maximum number of cross-bridges between the protein filaments that produce movement in the muscle.   5.   Optimize Your Vitamin D Levels Vitamin D, a steroid hormone, is essential for the healthy development of the nucleus of the sperm cell, and helps maintain semen quality and sperm count. Vitamin D also increases levels of testosterone, which may boost libido. In one study, overweight men who were given vitamin D supplements had a significant increase in testosterone levels after one year.5   6.   Reduce Stress When you're under a lot of stress, your body releases high levels of the stress hormone cortisol. This hormone actually blocks the effects of testosterone,6 presumably because, from a biological standpoint, testosterone-associated behaviors (mating, competing, aggression) may have lowered your chances of survival in an emergency (hence, the "fight or flight" response is dominant, courtesy of cortisol).  7.   Limit or Eliminate Sugar from Your Diet Testosterone levels decrease after you eat sugar, which is likely because the sugar leads to a high insulin level, another factor leading to low testosterone.7 Based on USDA estimates, the average American consumes 12 teaspoons of sugar a day, which equates to about TWO TONS of sugar during a lifetime.  8.   Eat Healthy Fats By healthy, this means not only mon- and polyunsaturated fats, like that found in avocadoes and nuts, but also saturated, as these are essential for building testosterone. Research shows  that a diet with less than 40 percent of energy as fat (and that mainly from animal sources, i.e. saturated) lead to a decrease in testosterone levels.8 My personal diet is about 60-70 percent healthy fat, and other experts agree that the ideal diet includes somewhere between 50-70 percent fat.  It's important to understand that your body requires saturated fats from animal and vegetable sources (such as meat, dairy, certain oils, and tropical plants like coconut) for optimal functioning, and if you neglect this important food group in favor of sugar, grains and other starchy carbs, your health and weight are almost guaranteed to suffer. Examples of healthy fats you can eat more of to give your testosterone levels a boost include: Olives and Olive oil  Coconuts and coconut oil Butter made from raw grass-fed organic milk Raw nuts, such as, almonds or pecans Organic pastured egg yolks Avocados Grass-fed meats Palm oil Unheated organic nut oils   9.   Boost Your Intake of Branch Chain Amino Acids (BCAA) from Foods Like Whey Protein Research suggests that BCAAs result in higher testosterone levels, particularly when taken along with resistance training.9 While BCAAs are available in supplement form, you'll find the highest concentrations of BCAAs like leucine in dairy products - especially quality cheeses and whey protein. Even when getting leucine from your natural food  supply, it's often wasted or used as a building block instead of an anabolic agent. So to create the correct anabolic environment, you need to boost leucine consumption way beyond mere maintenance levels. That said, keep in mind that using leucine as a free form amino acid can be highly counterproductive as when free form amino acids are artificially administrated, they rapidly enter your circulation while disrupting insulin function, and impairing your body's glycemic control. Food-based leucine is really the ideal form that can benefit your  muscles without side effects.

## 2015-02-07 LAB — TESTOSTERONE: TESTOSTERONE: 316 ng/dL (ref 300–890)

## 2015-02-07 LAB — VITAMIN D 25 HYDROXY (VIT D DEFICIENCY, FRACTURES): Vit D, 25-Hydroxy: 33 ng/mL (ref 30–100)

## 2015-02-16 ENCOUNTER — Ambulatory Visit: Payer: Self-pay | Admitting: Internal Medicine

## 2015-03-09 ENCOUNTER — Other Ambulatory Visit: Payer: Self-pay | Admitting: *Deleted

## 2015-03-09 DIAGNOSIS — F988 Other specified behavioral and emotional disorders with onset usually occurring in childhood and adolescence: Secondary | ICD-10-CM

## 2015-03-09 MED ORDER — AMPHETAMINE-DEXTROAMPHETAMINE 20 MG PO TABS: ORAL_TABLET | ORAL | Status: DC

## 2015-04-13 ENCOUNTER — Other Ambulatory Visit: Payer: Self-pay | Admitting: *Deleted

## 2015-04-13 DIAGNOSIS — F988 Other specified behavioral and emotional disorders with onset usually occurring in childhood and adolescence: Secondary | ICD-10-CM

## 2015-04-13 MED ORDER — AMPHETAMINE-DEXTROAMPHETAMINE 20 MG PO TABS: ORAL_TABLET | ORAL | Status: DC

## 2015-05-19 ENCOUNTER — Other Ambulatory Visit: Payer: Self-pay | Admitting: Physician Assistant

## 2015-05-19 DIAGNOSIS — F988 Other specified behavioral and emotional disorders with onset usually occurring in childhood and adolescence: Secondary | ICD-10-CM

## 2015-05-19 MED ORDER — AMPHETAMINE-DEXTROAMPHETAMINE 20 MG PO TABS: ORAL_TABLET | ORAL | Status: DC

## 2015-06-19 ENCOUNTER — Other Ambulatory Visit: Payer: Self-pay | Admitting: Internal Medicine

## 2015-06-19 DIAGNOSIS — F988 Other specified behavioral and emotional disorders with onset usually occurring in childhood and adolescence: Secondary | ICD-10-CM

## 2015-06-19 DIAGNOSIS — E782 Mixed hyperlipidemia: Secondary | ICD-10-CM

## 2015-06-19 MED ORDER — AMPHETAMINE-DEXTROAMPHETAMINE 20 MG PO TABS: ORAL_TABLET | ORAL | Status: DC

## 2015-06-19 MED ORDER — ROSUVASTATIN CALCIUM 20 MG PO TABS
20.0000 mg | ORAL_TABLET | Freq: Every day | ORAL | Status: DC
Start: 2015-06-19 — End: 2016-03-31

## 2015-07-02 ENCOUNTER — Other Ambulatory Visit: Payer: Self-pay | Admitting: Internal Medicine

## 2015-07-28 ENCOUNTER — Other Ambulatory Visit: Payer: Self-pay | Admitting: *Deleted

## 2015-07-28 DIAGNOSIS — F988 Other specified behavioral and emotional disorders with onset usually occurring in childhood and adolescence: Secondary | ICD-10-CM

## 2015-07-28 MED ORDER — AMPHETAMINE-DEXTROAMPHETAMINE 20 MG PO TABS: ORAL_TABLET | ORAL | Status: DC

## 2015-08-10 ENCOUNTER — Ambulatory Visit (INDEPENDENT_AMBULATORY_CARE_PROVIDER_SITE_OTHER): Payer: 59 | Admitting: Internal Medicine

## 2015-08-10 ENCOUNTER — Encounter: Payer: Self-pay | Admitting: Internal Medicine

## 2015-08-10 VITALS — BP 132/84 | HR 76 | Temp 97.5°F | Resp 16 | Ht 72.0 in | Wt 245.8 lb

## 2015-08-10 DIAGNOSIS — Z136 Encounter for screening for cardiovascular disorders: Secondary | ICD-10-CM

## 2015-08-10 DIAGNOSIS — E559 Vitamin D deficiency, unspecified: Secondary | ICD-10-CM

## 2015-08-10 DIAGNOSIS — E349 Endocrine disorder, unspecified: Secondary | ICD-10-CM

## 2015-08-10 DIAGNOSIS — Z0000 Encounter for general adult medical examination without abnormal findings: Secondary | ICD-10-CM

## 2015-08-10 DIAGNOSIS — R0989 Other specified symptoms and signs involving the circulatory and respiratory systems: Secondary | ICD-10-CM

## 2015-08-10 DIAGNOSIS — Z125 Encounter for screening for malignant neoplasm of prostate: Secondary | ICD-10-CM

## 2015-08-10 DIAGNOSIS — Z111 Encounter for screening for respiratory tuberculosis: Secondary | ICD-10-CM

## 2015-08-10 DIAGNOSIS — R5383 Other fatigue: Secondary | ICD-10-CM

## 2015-08-10 DIAGNOSIS — I10 Essential (primary) hypertension: Secondary | ICD-10-CM | POA: Diagnosis not present

## 2015-08-10 DIAGNOSIS — Z1212 Encounter for screening for malignant neoplasm of rectum: Secondary | ICD-10-CM

## 2015-08-10 DIAGNOSIS — R7303 Prediabetes: Secondary | ICD-10-CM

## 2015-08-10 DIAGNOSIS — Z79899 Other long term (current) drug therapy: Secondary | ICD-10-CM

## 2015-08-10 DIAGNOSIS — Z23 Encounter for immunization: Secondary | ICD-10-CM

## 2015-08-10 DIAGNOSIS — F988 Other specified behavioral and emotional disorders with onset usually occurring in childhood and adolescence: Secondary | ICD-10-CM

## 2015-08-10 DIAGNOSIS — E785 Hyperlipidemia, unspecified: Secondary | ICD-10-CM

## 2015-08-10 DIAGNOSIS — Z0001 Encounter for general adult medical examination with abnormal findings: Secondary | ICD-10-CM

## 2015-08-10 LAB — CBC WITH DIFFERENTIAL/PLATELET
BASOS PCT: 1 %
Basophils Absolute: 49 cells/uL (ref 0–200)
EOS ABS: 147 {cells}/uL (ref 15–500)
EOS PCT: 3 %
HCT: 45.4 % (ref 38.5–50.0)
Hemoglobin: 16.6 g/dL (ref 13.2–17.1)
LYMPHS PCT: 32 %
Lymphs Abs: 1568 cells/uL (ref 850–3900)
MCH: 33.3 pg — ABNORMAL HIGH (ref 27.0–33.0)
MCHC: 36.6 g/dL — ABNORMAL HIGH (ref 32.0–36.0)
MCV: 91.2 fL (ref 80.0–100.0)
MONOS PCT: 8 %
MPV: 9.7 fL (ref 7.5–12.5)
Monocytes Absolute: 392 cells/uL (ref 200–950)
Neutro Abs: 2744 cells/uL (ref 1500–7800)
Neutrophils Relative %: 56 %
PLATELETS: 145 10*3/uL (ref 140–400)
RBC: 4.98 MIL/uL (ref 4.20–5.80)
RDW: 13.7 % (ref 11.0–15.0)
WBC: 4.9 10*3/uL (ref 3.8–10.8)

## 2015-08-10 LAB — BASIC METABOLIC PANEL WITH GFR
BUN: 15 mg/dL (ref 7–25)
CO2: 26 mmol/L (ref 20–31)
CREATININE: 0.89 mg/dL (ref 0.70–1.33)
Calcium: 9.2 mg/dL (ref 8.6–10.3)
Chloride: 102 mmol/L (ref 98–110)
GFR, Est Non African American: >89 mL/min (ref >=60)
Glucose, Bld: 99 mg/dL (ref 65–99)
POTASSIUM: 4.3 mmol/L (ref 3.5–5.3)
Sodium: 139 mmol/L (ref 135–146)

## 2015-08-10 LAB — LIPID PANEL
Cholesterol: 164 mg/dL (ref 125–200)
HDL: 30 mg/dL — AB (ref >=40)
LDL Cholesterol: 98 mg/dL (ref <130)
TRIGLYCERIDES: 180 mg/dL — AB (ref <150)
Total CHOL/HDL Ratio: 5.5 Ratio — ABNORMAL HIGH (ref <=5.0)
VLDL: 36 mg/dL — ABNORMAL HIGH (ref <30)

## 2015-08-10 LAB — TESTOSTERONE: TESTOSTERONE: 218 ng/dL — AB (ref 250–827)

## 2015-08-10 LAB — HEMOGLOBIN A1C
Hgb A1c MFr Bld: 5.6 % (ref <5.7)
Mean Plasma Glucose: 114 mg/dL

## 2015-08-10 LAB — IRON AND TIBC
%SAT: 42 % (ref 15–60)
Iron: 137 ug/dL (ref 50–180)
TIBC: 326 ug/dL (ref 250–425)
UIBC: 189 ug/dL (ref 125–400)

## 2015-08-10 LAB — HEPATIC FUNCTION PANEL
ALBUMIN: 4.4 g/dL (ref 3.6–5.1)
ALK PHOS: 61 U/L (ref 40–115)
ALT: 59 U/L — ABNORMAL HIGH (ref 9–46)
AST: 36 U/L — ABNORMAL HIGH (ref 10–35)
BILIRUBIN TOTAL: 0.5 mg/dL (ref 0.2–1.2)
Bilirubin, Direct: 0.1 mg/dL (ref <=0.2)
Indirect Bilirubin: 0.4 mg/dL (ref 0.2–1.2)
Total Protein: 6.8 g/dL (ref 6.1–8.1)

## 2015-08-10 LAB — VITAMIN B12: Vitamin B-12: 460 pg/mL (ref 200–1100)

## 2015-08-10 LAB — MAGNESIUM: MAGNESIUM: 1.9 mg/dL (ref 1.5–2.5)

## 2015-08-10 LAB — TSH: TSH: 2.23 mIU/L (ref 0.40–4.50)

## 2015-08-10 NOTE — Progress Notes (Signed)
Patient ID: Joshua Schroeder, male   DOB: 1960/08/27, 55 y.o.   MRN: [id number]   [facility]   Lucky Cowboy, M.D.    Dyanne Carrel. Steffanie Brinkley, P.A.-C      Terri Piedra, P.A.-C   [facility]                [address]                [place], [place]. [address] Telephone [phone] Telefax [phone] _________________________________  Annual  Screening/Preventative Visit And Comprehensive Evaluation & Examination     This very nice 55 y.o. MWM presents for a Wellness/Preventative Visit & comprehensive evaluation and management of multiple medical co-morbidities.  Patient has been followed for HTN, Prediabetes, Hyperlipidemia, ADD, testosterone  and Vitamin D Deficiency. Patient has hx/o ADD wnd had intermittently been on treatment with Adderall with improved sense of focus & concentration.      HTN predates since 2002. Patient's BP has been controlled at home. Today's BP: 132/84 mmHg. Patient denies any cardiac symptoms as chest pain, palpitations, shortness of breath, dizziness or ankle swelling.     Patient's hyperlipidemia is not controlled with diet and medications. Patient denies myalgias or other medication SE's. Last lipids were not at goal with Cholesterol 208*; HDL 31*; LDL 126; Triglycerides 257 on 02/06/2015.     Patient has has morbid Obesity (BMI 33+) and consequently is screened for prediabetes. In 2011 A1c was 5.8% and patient denies reactive hypoglycemic symptoms, visual blurring, diabetic polys or paresthesias. Last A1c was 5.7% on 02/06/2015.       Patient has hx/o Low T of 178 in 2003 and has sporadically been on treatment with D-Testosterone. He was on Clomid for a period of time. Finally, patient has history of Vitamin D Deficiency of "16" in 2008 and has had higher levels up to 68 in the past, but last vitamin D was still very low at 33 on 02/06/2015.    Medication Sig  . ADDERALL 20 MG tablet Take 1/2  to 1 tablet 2 x daily as needed for ADD   Vit D 5,000 u Takes 1 capsule daily - sporadically  . Metamucil powder  at bedtime.  . rosuvastatin20 MG tablet Take 1 tablet (20 mg total) by mouth daily. For cholesterol - takes 1/2   . sildenafil  20 MG tablet Take 1-5 tabs qd prn XXXX  . testosterone cypionate  200 MG/ML in Inject 1 mL (200 mg total) into the muscle every 14 (fourteen) days.   No Known Allergies   Past Medical History  Diagnosis Date  . Hyperlipidemia   . Labile hypertension   . ADD (attention deficit disorder)   . Vitamin D deficiency   . Other testicular hypofunction   . Prediabetes    Health Maintenance  Topic Date Due  . Hepatitis C Screening  [birth year]  . HIV Screening  09/21/1976  . TETANUS/TDAP  09/21/1980  . COLONOSCOPY  09/22/2011  . INFLUENZA VACCINE  10/27/2015   Immunization History  Administered Date(s) Administered  . DT 09/20/2006  . PPD Test 07/24/2014  . Pneumococcal-Unspecified 03/28/1998   Past Surgical History  Procedure Laterality Date  . Shoulder surgery Right 2006  . Vasectomy  1994  . Ankle surgery Right 1983   Family History  Problem Relation Age of Onset  . Hyperlipidemia Mother   . Hyperlipidemia Father    Social History   Social History  . Marital Status: Married  Spouse Name: N/A  . Number of Children: N/A  . Years of Education: N/A   Occupational History  . Retired   Social History Main Topics  . Smoking status: Former Games developermoker  . Smokeless tobacco: Never Used  . Alcohol Use: No     Comment: social  . Drug Use: No  . Sexual Activity: Not on file   Other Topics Concern  . Not on file   Social History Narrative    ROS Constitutional: Denies fever, chills, weight loss/gain, headaches, insomnia,  night sweats or change in appetite. Does c/o fatigue. Eyes: Denies redness, blurred vision, diplopia, discharge, itchy or watery eyes.  ENT: Denies discharge, congestion, post nasal drip, epistaxis, sore throat,  earache, hearing loss, dental pain, Tinnitus, Vertigo, Sinus pain or snoring.  Cardio: Denies chest pain, palpitations, irregular heartbeat, syncope, dyspnea, diaphoresis, orthopnea, PND, claudication or edema Respiratory: denies cough, dyspnea, DOE, pleurisy, hoarseness, laryngitis or wheezing.  Gastrointestinal: Denies dysphagia, heartburn, reflux, water brash, pain, cramps, nausea, vomiting, bloating, diarrhea, constipation, hematemesis, melena, hematochezia, jaundice or hemorrhoids Genitourinary: Denies dysuria, frequency, urgency, nocturia, hesitancy, discharge, hematuria or flank pain Musculoskeletal: Denies arthralgia, myalgia, stiffness, Jt. Swelling, pain, limp or strain/sprain. Denies Falls. Skin: Denies puritis, rash, hives, warts, acne, eczema or change in skin lesion Neuro: No weakness, tremor, incoordination, spasms, paresthesia or pain Psychiatric: Denies confusion, memory loss or sensory loss. Denies Depression. Endocrine: Denies change in weight, skin, hair change, nocturia, and paresthesia, diabetic polys, visual blurring or hyper / hypo glycemic episodes.  Heme/Lymph: No excessive bleeding, bruising or enlarged lymph nodes.  Physical Exam  BP 132/84 mmHg  Pulse 76  Temp(Src) 97.5 F (36.4 C)  Resp 16  Ht 6' (1.829 m)  Wt 245 lb 12.8 oz (111.494 kg)  BMI 33.33 kg/m2  General Appearance: Well nourished, in no apparent distress.  Eyes: PERRLA, EOMs, conjunctiva no swelling or erythema, normal fundi and vessels. Sinuses: No frontal/maxillary tenderness ENT/Mouth: EACs patent / TMs  nl. Nares clear without erythema, swelling, mucoid exudates. Oral hygiene is good. No erythema, swelling, or exudate. Tongue normal, non-obstructing. Tonsils not swollen or erythematous. Hearing normal.  Neck: Supple, thyroid normal. No bruits, nodes or JVD. Respiratory: Respiratory effort normal.  BS equal and clear bilateral without rales, rhonci, wheezing or stridor. Cardio: Heart sounds are  normal with regular rate and rhythm and no murmurs, rubs or gallops. Peripheral pulses are normal and equal bilaterally without edema. No aortic or femoral bruits. Chest: symmetric with normal excursions and percussion.  Abdomen: Soft, with Nl bowel sounds. Nontender, no guarding, rebound, hernias, masses, or organomegaly.  Lymphatics: Non tender without lymphadenopathy.  Genitourinary: No hernias.Testes nl. DRE - prostate nl for age - smooth & firm w/o nodules. Musculoskeletal: Full ROM all peripheral extremities, joint stability, 5/5 strength, and normal gait. Skin: Warm and dry without rashes, lesions, cyanosis, clubbing or  ecchymosis.  Neuro: Cranial nerves intact, reflexes equal bilaterally. Normal muscle tone, no cerebellar symptoms. Sensation intact.  Pysch: Alert and oriented X 3 with normal affect, insight and judgment appropriate.   Assessment and Plan  1. Annual Preventative/Screening Exam   2. Hypertension  - Microalbumin / creatinine urine ratio - EKG 12-Lead - US, RETROPERITNL ABD,  LTD - TSH  3. Hyperlipidemia  - Lipid panel - TSH  4. Prediabetes  - Hemoglobin A1c - Insulin, random  5. Vitamin D deficiency  - VITAMIN D 25 Hydroxy   6. Testosterone deficiency  - Testosterone  7. ADD (attention deficit disorder)   8.  Screening for rectal cancer  - POC Hemoccult Bld/Stl   9. Prostate cancer screening  - PSA - Testosterone  10. Medication management  - Urinalysis, Routine w reflex microscopic  - CBC with Differential/Platelet - BASIC METABOLIC PANEL WITH GFR - Hepatic function panel - Magnesium  11. Other fatigue  - Vitamin B12 - Iron and TIBC - CBC with Differential/Platelet   Continue prudent diet as discussed, weight control, BP monitoring, regular exercise, and medications as discussed.  Discussed med effects and SE's. Routine screening labs and tests as requested with regular follow-up as recommended. Over 40 minutes of exam,  counseling, chart review and high complex critical decision making was performed

## 2015-08-10 NOTE — Patient Instructions (Signed)

## 2015-08-11 ENCOUNTER — Other Ambulatory Visit: Payer: Self-pay | Admitting: Internal Medicine

## 2015-08-11 DIAGNOSIS — R7989 Other specified abnormal findings of blood chemistry: Secondary | ICD-10-CM

## 2015-08-11 DIAGNOSIS — R945 Abnormal results of liver function studies: Secondary | ICD-10-CM

## 2015-08-11 LAB — INSULIN, RANDOM: INSULIN: 21.8 u[IU]/mL — AB (ref 2.0–19.6)

## 2015-08-11 LAB — VITAMIN D 25 HYDROXY (VIT D DEFICIENCY, FRACTURES): VIT D 25 HYDROXY: 37 ng/mL (ref 30–100)

## 2015-08-11 LAB — PSA: PSA: 0.61 ng/mL (ref <=4.00)

## 2015-08-11 LAB — URINALYSIS, ROUTINE W REFLEX MICROSCOPIC
BILIRUBIN URINE: NEGATIVE
GLUCOSE, UA: NEGATIVE
HGB URINE DIPSTICK: NEGATIVE
Ketones, ur: NEGATIVE
LEUKOCYTES UA: NEGATIVE
Nitrite: NEGATIVE
PH: 6.5 (ref 5.0–8.0)
PROTEIN: NEGATIVE
Specific Gravity, Urine: 1.007 (ref 1.001–1.035)

## 2015-08-11 LAB — MICROALBUMIN / CREATININE URINE RATIO
Creatinine, Urine: 56 mg/dL (ref 20–370)
MICROALB UR: 2.3 mg/dL
MICROALB/CREAT RATIO: 41 ug/mg{creat} — AB (ref <30)

## 2015-08-13 ENCOUNTER — Telehealth: Payer: Self-pay | Admitting: *Deleted

## 2015-08-13 LAB — TB SKIN TEST
INDURATION: 0 mm
TB Skin Test: NEGATIVE

## 2015-08-13 NOTE — Telephone Encounter (Signed)
Pt aware of lab results and informed him of [facility] at 8:30 AM and be NPO after midnight prior to the exam.

## 2015-08-20 ENCOUNTER — Ambulatory Visit
Admission: RE | Admit: 2015-08-20 | Discharge: 2015-08-20 | Disposition: A | Discharge status: Home / Self Care | Payer: 59 | Source: Ambulatory Visit | Attending: Internal Medicine | Admitting: Internal Medicine

## 2015-08-20 DIAGNOSIS — R945 Abnormal results of liver function studies: Secondary | ICD-10-CM

## 2015-08-20 DIAGNOSIS — R7989 Other specified abnormal findings of blood chemistry: Secondary | ICD-10-CM

## 2015-09-01 ENCOUNTER — Other Ambulatory Visit: Payer: Self-pay | Admitting: Physician Assistant

## 2015-09-01 ENCOUNTER — Other Ambulatory Visit: Payer: Self-pay | Admitting: Internal Medicine

## 2015-09-09 ENCOUNTER — Other Ambulatory Visit: Payer: Self-pay | Admitting: *Deleted

## 2015-09-09 DIAGNOSIS — F988 Other specified behavioral and emotional disorders with onset usually occurring in childhood and adolescence: Secondary | ICD-10-CM

## 2015-09-09 MED ORDER — AMPHETAMINE-DEXTROAMPHETAMINE 20 MG PO TABS: ORAL_TABLET | ORAL | Status: DC

## 2015-10-12 ENCOUNTER — Other Ambulatory Visit: Payer: Self-pay | Admitting: Physician Assistant

## 2015-10-12 DIAGNOSIS — F988 Other specified behavioral and emotional disorders with onset usually occurring in childhood and adolescence: Secondary | ICD-10-CM

## 2015-10-12 MED ORDER — AMPHETAMINE-DEXTROAMPHETAMINE 20 MG PO TABS: ORAL_TABLET | ORAL | Status: DC

## 2015-10-20 ENCOUNTER — Other Ambulatory Visit: Payer: Self-pay | Admitting: Internal Medicine

## 2015-10-20 DIAGNOSIS — N529 Male erectile dysfunction, unspecified: Secondary | ICD-10-CM

## 2015-11-18 ENCOUNTER — Other Ambulatory Visit: Payer: Self-pay

## 2015-11-18 DIAGNOSIS — F988 Other specified behavioral and emotional disorders with onset usually occurring in childhood and adolescence: Secondary | ICD-10-CM

## 2015-11-18 MED ORDER — AMPHETAMINE-DEXTROAMPHETAMINE 20 MG PO TABS: ORAL_TABLET | ORAL | 0 refills | Status: DC

## 2015-11-19 ENCOUNTER — Ambulatory Visit (INDEPENDENT_AMBULATORY_CARE_PROVIDER_SITE_OTHER): Payer: 59 | Admitting: Internal Medicine

## 2015-11-19 ENCOUNTER — Encounter: Payer: Self-pay | Admitting: Internal Medicine

## 2015-11-19 VITALS — BP 150/98 | HR 80 | Temp 98.2°F | Resp 16 | Wt 254.0 lb

## 2015-11-19 DIAGNOSIS — Z720 Tobacco use: Secondary | ICD-10-CM

## 2015-11-19 DIAGNOSIS — F988 Other specified behavioral and emotional disorders with onset usually occurring in childhood and adolescence: Secondary | ICD-10-CM

## 2015-11-19 DIAGNOSIS — R0989 Other specified symptoms and signs involving the circulatory and respiratory systems: Secondary | ICD-10-CM

## 2015-11-19 DIAGNOSIS — I10 Essential (primary) hypertension: Secondary | ICD-10-CM | POA: Diagnosis not present

## 2015-11-19 DIAGNOSIS — F909 Attention-deficit hyperactivity disorder, unspecified type: Secondary | ICD-10-CM | POA: Diagnosis not present

## 2015-11-19 DIAGNOSIS — E559 Vitamin D deficiency, unspecified: Secondary | ICD-10-CM

## 2015-11-19 DIAGNOSIS — M25562 Pain in left knee: Secondary | ICD-10-CM | POA: Diagnosis not present

## 2015-11-19 DIAGNOSIS — Z79899 Other long term (current) drug therapy: Secondary | ICD-10-CM | POA: Diagnosis not present

## 2015-11-19 DIAGNOSIS — R7303 Prediabetes: Secondary | ICD-10-CM | POA: Diagnosis not present

## 2015-11-19 DIAGNOSIS — E785 Hyperlipidemia, unspecified: Secondary | ICD-10-CM | POA: Diagnosis not present

## 2015-11-19 NOTE — Progress Notes (Signed)
Assessment and Plan:  Hypertension:  -Very elevated -recommended that the patient monitor at home -Continue medication,  -monitor blood pressure at home.  -Continue DASH diet.   -Reminder to go to the ER if any CP, SOB, nausea, dizziness, severe HA, changes vision/speech, left arm numbness and tingling, and jaw pain.  Cholesterol: -Continue diet and exercise.    Pre-diabetes: -Continue diet and exercise.   Vitamin D Def: -continue medications.   ADD -cont adderall -meant to be only on medication during weekdays when not working  Tobacco abuse -reports successfully stopping chewing tobacco x 2 months  Left knee pain -patient to call murphy wainer for an appointment  Continue diet and meds as discussed. Further disposition pending results of labs.  HPI 55 y.o. male  presents for 3 month follow up with hypertension, hyperlipidemia, prediabetes and vitamin D.   His blood pressure has been controlled at home, today their BP is BP: (!) 150/98.   He does workout intermittently. He denies chest pain, shortness of breath, dizziness.  He reports that he has taken both his adderall and has also had 3-4 cups of coffee this morning.  He also notes he is aggravated with having a bill here.     He is on cholesterol medication and denies myalgias. His cholesterol is at goal. The cholesterol last visit was:   Lab Results  Component Value Date   CHOL 164 08/10/2015   HDL 30 (L) 08/10/2015   LDLCALC 98 08/10/2015   TRIG 180 (H) 08/10/2015   CHOLHDL 5.5 (H) 08/10/2015     He has been working on diet and exercise for prediabetes, and denies foot ulcerations, hyperglycemia, hypoglycemia , increased appetite, nausea, paresthesia of the feet, polydipsia, polyuria, visual disturbances, vomiting and weight loss. Last A1C in the office was:  Lab Results  Component Value Date   HGBA1C 5.6 08/10/2015    Patient is on Vitamin D supplement.  Lab Results  Component Value Date   VD25OH 37  08/10/2015     He reports that he left knee has been bothering him intermittently.  He would like to get back set up with Dr. Thurston HoleWainer.    He has quit smokeless tobacco.   He is currently on adderall on weekdays.  He was recently given a paper prescription.  He has no trouble with focus while taking the medication.      Current Medications:  Current Outpatient Prescriptions on File Prior to Visit  Medication Sig Dispense Refill  . amphetamine-dextroamphetamine (ADDERALL) 20 MG tablet Take 1/2 to 1 tablet 2 x daily as needed for ADD Should only be taken 5 days a week Max dose 1 & half tablet a day. Should last 8 weeks 60 tablet 0  . NEEDLE, DISP, 21 G 21G X 1" MISC Use for testosterone injections, 3 ml 100 each 0  . OVER THE COUNTER MEDICATION Metamucil powder at bedtime.    . rosuvastatin (CRESTOR) 20 MG tablet Take 1 tablet (20 mg total) by mouth daily. For cholesterol 90 tablet 2  . sildenafil (REVATIO) 20 MG tablet TAKE 1-5 TABLETS BY MOUTH ONCE DAILY AS NEEDED 90 tablet 0  . testosterone cypionate (DEPOTESTOSTERONE CYPIONATE) 200 MG/ML injection INJECT 1 ML INTO THE MUSCLE EVERY 14 DAYS 10 mL 2   No current facility-administered medications on file prior to visit.     Medical History:  Past Medical History:  Diagnosis Date  . ADD (attention deficit disorder)   . Hyperlipidemia   . Labile hypertension   .  Other testicular hypofunction   . Prediabetes   . Vitamin D deficiency     Allergies: No Known Allergies   Review of Systems:  Review of Systems  Constitutional: Negative for chills, fever and malaise/fatigue.  HENT: Negative for congestion, ear pain and sore throat.   Eyes: Negative.   Respiratory: Negative for cough, shortness of breath and wheezing.   Cardiovascular: Negative for chest pain, palpitations and leg swelling.  Gastrointestinal: Negative for abdominal pain, blood in stool, constipation, diarrhea, heartburn and melena.  Genitourinary: Negative.   Skin:  Negative.   Neurological: Negative for dizziness, sensory change, loss of consciousness and headaches.  Psychiatric/Behavioral: Negative for depression. The patient is not nervous/anxious and does not have insomnia.     Family history- Review and unchanged  Social history- Review and unchanged  Physical Exam: BP (!) 150/98   Pulse 80   Temp 98.2 F (36.8 C) (Temporal)   Resp 16   Ht (P) 6' (1.829 m)   Wt 254 lb (115.2 kg)   BMI (P) 34.45 kg/m  Wt Readings from Last 3 Encounters:  11/19/15 254 lb (115.2 kg)  08/10/15 245 lb 12.8 oz (111.5 kg)  02/06/15 242 lb (109.8 kg)    General Appearance: Well nourished well developed, in no apparent distress. Eyes: PERRLA, EOMs, conjunctiva no swelling or erythema ENT/Mouth: Ear canals normal without obstruction, swelling, erythma, discharge.  TMs normal bilaterally.  Oropharynx moist, clear, without exudate, or postoropharyngeal swelling. Neck: Supple, thyroid normal,no cervical adenopathy  Respiratory: Respiratory effort normal, Breath sounds clear A&P without rhonchi, wheeze, or rale.  No retractions, no accessory usage. Cardio: RRR with no MRGs. Brisk peripheral pulses without edema.  Abdomen: Soft, + BS,  Non tender, no guarding, rebound, hernias, masses. Musculoskeletal: Full ROM, 5/5 strength, Normal gait Skin: Warm, dry without rashes, lesions, ecchymosis.  Neuro: Awake and oriented X 3, Cranial nerves intact. Normal muscle tone, no cerebellar symptoms. Psych: Normal affect, Insight and Judgment appropriate.    Terri Piedraourtney Forcucci, PA-C 9:06 AM [facility]

## 2015-12-14 ENCOUNTER — Other Ambulatory Visit: Payer: Self-pay | Admitting: Physician Assistant

## 2015-12-14 DIAGNOSIS — N529 Male erectile dysfunction, unspecified: Secondary | ICD-10-CM

## 2015-12-18 ENCOUNTER — Other Ambulatory Visit: Payer: Self-pay | Admitting: Internal Medicine

## 2016-01-19 ENCOUNTER — Other Ambulatory Visit: Payer: Self-pay | Admitting: *Deleted

## 2016-01-19 MED ORDER — AMPHETAMINE-DEXTROAMPHETAMINE 20 MG PO TABS: ORAL_TABLET | ORAL | 0 refills | Status: DC

## 2016-02-23 ENCOUNTER — Ambulatory Visit: Payer: Self-pay | Admitting: Internal Medicine

## 2016-02-25 ENCOUNTER — Ambulatory Visit: Payer: Self-pay | Admitting: Internal Medicine

## 2016-03-17 ENCOUNTER — Other Ambulatory Visit: Payer: Self-pay | Admitting: Internal Medicine

## 2016-03-17 DIAGNOSIS — N529 Male erectile dysfunction, unspecified: Secondary | ICD-10-CM

## 2016-03-31 ENCOUNTER — Ambulatory Visit (INDEPENDENT_AMBULATORY_CARE_PROVIDER_SITE_OTHER): Payer: 59 | Admitting: Internal Medicine

## 2016-03-31 ENCOUNTER — Encounter: Payer: Self-pay | Admitting: Internal Medicine

## 2016-03-31 ENCOUNTER — Other Ambulatory Visit: Payer: Self-pay | Admitting: *Deleted

## 2016-03-31 VITALS — BP 142/84 | HR 80 | Temp 97.5°F | Resp 16 | Ht 72.0 in | Wt 261.0 lb

## 2016-03-31 DIAGNOSIS — Z79899 Other long term (current) drug therapy: Secondary | ICD-10-CM

## 2016-03-31 DIAGNOSIS — E782 Mixed hyperlipidemia: Secondary | ICD-10-CM

## 2016-03-31 DIAGNOSIS — E291 Testicular hypofunction: Secondary | ICD-10-CM

## 2016-03-31 DIAGNOSIS — R7303 Prediabetes: Secondary | ICD-10-CM

## 2016-03-31 DIAGNOSIS — N529 Male erectile dysfunction, unspecified: Secondary | ICD-10-CM

## 2016-03-31 DIAGNOSIS — I10 Essential (primary) hypertension: Secondary | ICD-10-CM | POA: Diagnosis not present

## 2016-03-31 DIAGNOSIS — R0989 Other specified symptoms and signs involving the circulatory and respiratory systems: Secondary | ICD-10-CM

## 2016-03-31 DIAGNOSIS — E559 Vitamin D deficiency, unspecified: Secondary | ICD-10-CM

## 2016-03-31 LAB — CBC WITH DIFFERENTIAL/PLATELET
BASOS PCT: 1 %
Basophils Absolute: 42 cells/uL (ref 0–200)
EOS ABS: 210 {cells}/uL (ref 15–500)
Eosinophils Relative: 5 %
HEMATOCRIT: 46.4 % (ref 38.5–50.0)
Hemoglobin: 15.9 g/dL (ref 13.2–17.1)
LYMPHS PCT: 34 %
Lymphs Abs: 1428 cells/uL (ref 850–3900)
MCH: 31.6 pg (ref 27.0–33.0)
MCHC: 34.3 g/dL (ref 32.0–36.0)
MCV: 92.2 fL (ref 80.0–100.0)
MONO ABS: 336 {cells}/uL (ref 200–950)
MPV: 9.3 fL (ref 7.5–12.5)
Monocytes Relative: 8 %
Neutro Abs: 2184 cells/uL (ref 1500–7800)
Neutrophils Relative %: 52 %
Platelets: 144 10*3/uL (ref 140–400)
RBC: 5.03 MIL/uL (ref 4.20–5.80)
RDW: 14.2 % (ref 11.0–15.0)
WBC: 4.2 10*3/uL (ref 3.8–10.8)

## 2016-03-31 LAB — TSH: TSH: 2.8 mIU/L (ref 0.40–4.50)

## 2016-03-31 LAB — HEMOGLOBIN A1C
Hgb A1c MFr Bld: 5.4 % (ref <5.7)
Mean Plasma Glucose: 108 mg/dL

## 2016-03-31 MED ORDER — TESTOSTERONE CYPIONATE 200 MG/ML IM SOLN: INTRAMUSCULAR | 1 refills | Status: AC

## 2016-03-31 MED ORDER — ROSUVASTATIN CALCIUM 20 MG PO TABS: 20.0000 mg | ORAL_TABLET | Freq: Every day | ORAL | 0 refills | Status: AC

## 2016-03-31 MED ORDER — SILDENAFIL CITRATE 20 MG PO TABS: ORAL_TABLET | ORAL | 0 refills | Status: AC

## 2016-03-31 NOTE — Patient Instructions (Signed)

## 2016-03-31 NOTE — Progress Notes (Signed)
[facility] Joshua Schroeder, M.D.        Dyanne Carrel. Steffanie Frate, P.A.-C       Terri Piedra, P.A.-C  [facility]                [address]                [place], [place]. [address] Telephone [phone] Telefax [phone] ______________________________________________________________________     This very nice 56 y.o. MWM presents for 6 month follow up with Hypertension, Hyperlipidemia, Pre-Diabetes, Hypogonadism and Vitamin D Deficiency. Patient also has ADD on intermittent treatment with Adderall reporting improved ability to focus & concentrate.      Patient is treated for HTN circa 2002 & BP has been controlled at home. Today's BP is 142/84. Patient has had no complaints of any cardiac type chest pain, palpitations, dyspnea/orthopnea/PND, dizziness, claudication, or dependent edema.     Hyperlipidemia is controlled with diet & meds. Patient denies myalgias or other med SE's. Last Lipids were at goal albeit sl elevated Trig's: Lab Results  Component Value Date   CHOL 164 08/10/2015   HDL 30 (L) 08/10/2015   LDLCALC 98 08/10/2015   TRIG 180 (H) 08/10/2015   CHOLHDL 5.5 (H) 08/10/2015      Also, the patient has history of Morbid Obesity and has gained more weight and BMI has gone up from 33+ to 35+ . Patient has consequent PreDiabetes since 2011 with A1c 5.8% and has had no symptoms of reactive hypoglycemia, diabetic polys, paresthesias or visual blurring. Patient recognizes his need for weight loss.  Last A1c was at goal: Lab Results  Component Value Date   HGBA1C 5.6 08/10/2015      Patient is Hypogonadal with a low level of Testosterone  "178" in 2003 and sporadically is taking supplements.     Further, the patient also has history of Vitamin D Deficiency in 2008 of "16" and was still low in Nov 2016 of "33" and sporadically supplements Vit D. Last vitamin D was still low: Lab Results  Component Value Date   VD25OH 37 08/10/2015   Medication Sig  . Metamucil powder  at bedtime.  . rosuvastatin  20 MG tablet Take 1 tab daily  . sildenafil  20 MG tablet TAKE ONE TO FIVE TAB ONCE DAILY AS NEEDED  . testosterone cypion 200 MG inject 1-2 ml every 2 weeks  . ADDERALL 20 MG tablet Patient reports self discontinuance for concern of habituation   No Known Allergies PMHx:   Past Medical History:  Diagnosis Date  . ADD (attention deficit disorder)   . Hyperlipidemia   . Labile hypertension   . Other testicular hypofunction   . Prediabetes   . Vitamin D deficiency    Immunization History  Administered Date(s) Administered  . DT 09/20/2006  . PPD Test 07/24/2014, 08/10/2015  . Pneumococcal-Unspecified 03/28/1998  . Tdap 08/10/2015   Past Surgical History:  Procedure Laterality Date  . ANKLE SURGERY Right 1983  . SHOULDER SURGERY Right 2006  . VASECTOMY  1994   FHx:    Reviewed / unchanged  SHx:    Reviewed / unchanged  Systems Review:  Constitutional: Denies fever, chills, wt changes, headaches, insomnia, fatigue, night sweats, change in appetite. Eyes: Denies redness, blurred vision, diplopia, discharge, itchy, watery eyes.  ENT: Denies discharge, congestion, post nasal drip, epistaxis, sore throat, earache, hearing loss, dental pain, tinnitus, vertigo, sinus pain, snoring.  CV: Denies chest pain,  palpitations, irregular heartbeat, syncope, dyspnea, diaphoresis, orthopnea, PND, claudication or edema. Respiratory: denies cough, dyspnea, DOE, pleurisy, hoarseness, laryngitis, wheezing.  Gastrointestinal: Denies dysphagia, odynophagia, heartburn, reflux, water brash, abdominal pain or cramps, nausea, vomiting, bloating, diarrhea, constipation, hematemesis, melena, hematochezia  or hemorrhoids. Genitourinary: Denies dysuria, frequency, urgency, nocturia, hesitancy, discharge, hematuria or flank pain. Musculoskeletal: Denies arthralgias, myalgias, stiffness, jt. swelling, pain, limping or  strain/sprain.  Skin: Denies pruritus, rash, hives, warts, acne, eczema or change in skin lesion(s). Neuro: No weakness, tremor, incoordination, spasms, paresthesia or pain. Psychiatric: Denies confusion, memory loss or sensory loss. Endo: Denies change in weight, skin or hair change.  Heme/Lymph: No excessive bleeding, bruising or enlarged lymph nodes.  Physical Exam  BP (!) 142/84   Pulse 80   Temp 97.5 F (36.4 C)   Resp 16   Ht 6' (1.829 m)   Wt 261 lb (118.4 kg)   BMI 35.40 kg/m   Appears well nourished and in no distress.  Eyes: PERRLA, EOMs, conjunctiva no swelling or erythema. Sinuses: No frontal/maxillary tenderness ENT/Mouth: EAC's clear, TM's nl w/o erythema, bulging. Nares clear w/o erythema, swelling, exudates. Oropharynx clear without erythema or exudates. Oral hygiene is good. Tongue normal, non obstructing. Hearing intact.  Neck: Supple. Thyroid nl. Car 2+/2+ without bruits, nodes or JVD. Chest: Respirations nl with BS clear & equal w/o rales, rhonchi, wheezing or stridor.  Cor: Heart sounds normal w/ regular rate and rhythm without sig. murmurs, gallops, clicks, or rubs. Peripheral pulses normal and equal  without edema.  Abdomen: Soft & bowel sounds normal. Non-tender w/o guarding, rebound, hernias, masses, or organomegaly.  Lymphatics: Unremarkable.  Musculoskeletal: Full ROM all peripheral extremities, joint stability, 5/5 strength, and normal gait.  Skin: Warm, dry without exposed rashes, lesions or ecchymosis apparent.  Neuro: Cranial nerves intact, reflexes equal bilaterally. Sensory-motor testing grossly intact. Tendon reflexes grossly intact.  Pysch: Alert & oriented x 3.  Insight and judgement nl & appropriate. No ideations.  Assessment and Plan:  1. Hypertension  - Continue medication, monitor blood pressure at home.  - Continue DASH diet. Reminder to go to the ER if any CP,  SOB, nausea, dizziness, severe HA, changes vision/speech,  left arm  numbness and tingling and jaw pain.  - CBC with Differential/Platelet - BASIC METABOLIC PANEL WITH GFR - TSH  2. Mixed hyperlipidemia  - Continue diet/meds, exercise,& lifestyle modifications.  - Continue monitor periodic cholesterol/liver & renal functions   - Hepatic function panel - Lipid panel - TSH  3. Prediabetes  - Continue diet, exercise, lifestyle modifications.  - Monitor appropriate labs. - Hemoglobin A1c - Insulin, random  4. Vitamin D deficiency  - Continue supplementation. - VITAMIN D 25 Hydroxy   5. Hypogonadism in male  - Testosterone  6. Medication management  - CBC with Differential/Platelet - BASIC METABOLIC PANEL WITH GFR - Hepatic function panel - Magnesium       Recommended regular exercise, BP monitoring, weight control, and discussed med and SE's. Recommended labs to assess and monitor clinical status. Further disposition pending results of labs. Over 30 minutes of exam, counseling, chart review was performed

## 2016-04-01 LAB — LIPID PANEL
CHOL/HDL RATIO: 7.9 ratio — AB (ref <5.0)
Cholesterol: 213 mg/dL — ABNORMAL HIGH (ref <200)
HDL: 27 mg/dL — AB (ref >40)
LDL Cholesterol: 130 mg/dL — ABNORMAL HIGH (ref <100)
TRIGLYCERIDES: 278 mg/dL — AB (ref <150)
VLDL: 56 mg/dL — ABNORMAL HIGH (ref <30)

## 2016-04-01 LAB — INSULIN, RANDOM: INSULIN: 20.8 u[IU]/mL — AB (ref 2.0–19.6)

## 2016-04-01 LAB — HEPATIC FUNCTION PANEL
ALBUMIN: 4.1 g/dL (ref 3.6–5.1)
ALK PHOS: 57 U/L (ref 40–115)
ALT: 117 U/L — ABNORMAL HIGH (ref 9–46)
AST: 71 U/L — AB (ref 10–35)
Bilirubin, Direct: 0.1 mg/dL (ref <=0.2)
Indirect Bilirubin: 0.4 mg/dL (ref 0.2–1.2)
TOTAL PROTEIN: 6.7 g/dL (ref 6.1–8.1)
Total Bilirubin: 0.5 mg/dL (ref 0.2–1.2)

## 2016-04-01 LAB — TESTOSTERONE: Testosterone: 266 ng/dL (ref 250–827)

## 2016-04-01 LAB — VITAMIN D 25 HYDROXY (VIT D DEFICIENCY, FRACTURES): VIT D 25 HYDROXY: 45 ng/mL (ref 30–100)

## 2016-04-01 LAB — BASIC METABOLIC PANEL WITH GFR
BUN: 12 mg/dL (ref 7–25)
CHLORIDE: 103 mmol/L (ref 98–110)
CO2: 22 mmol/L (ref 20–31)
Calcium: 8.9 mg/dL (ref 8.6–10.3)
Creat: 0.94 mg/dL (ref 0.70–1.33)
GFR, Est African American: >89 mL/min (ref >=60)
GFR, Est Non African American: >89 mL/min (ref >=60)
GLUCOSE: 107 mg/dL — AB (ref 65–99)
POTASSIUM: 4.2 mmol/L (ref 3.5–5.3)
Sodium: 139 mmol/L (ref 135–146)

## 2016-04-01 LAB — MAGNESIUM: Magnesium: 1.8 mg/dL (ref 1.5–2.5)

## 2016-05-24 ENCOUNTER — Other Ambulatory Visit: Payer: Self-pay | Admitting: Internal Medicine

## 2016-05-24 ENCOUNTER — Telehealth: Payer: Self-pay

## 2016-05-24 NOTE — Telephone Encounter (Signed)
Pt called wanting a Rx for ABX as a preventive for a tick bite. 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 Pt then went on about how much land he had & how this back & forth about an ABX Rx is just an inconvenience for him. I informed pt again that if his wife was bitten & had some oozing & swelling at the tick site then just as a preventive she should just come in & have the site looked at.   Pt states he will not come in just for an ABX.  I then informed the pt again that Per the provider his wife our pt needs to come in & have the tick bite looked at.  Pt stated again that he would not be coming into the office.  Pt then stated that the Dr. Did not need to hide behind anyone in the office & call him so that he may tell him this his self. Mr. Joshua Schroeder then stated that if he had to come in for an ABX then he would just find a REAL doctor.

## 2016-07-14 ENCOUNTER — Ambulatory Visit: Payer: Self-pay | Admitting: Physician Assistant

## 2016-07-28 ENCOUNTER — Other Ambulatory Visit: Payer: Self-pay | Admitting: Internal Medicine

## 2016-07-28 DIAGNOSIS — N529 Male erectile dysfunction, unspecified: Secondary | ICD-10-CM

## 2016-08-11 ENCOUNTER — Other Ambulatory Visit: Payer: Self-pay | Admitting: Internal Medicine

## 2016-08-11 DIAGNOSIS — N529 Male erectile dysfunction, unspecified: Secondary | ICD-10-CM

## 2016-08-24 ENCOUNTER — Encounter: Payer: Self-pay | Admitting: Family Medicine

## 2016-09-07 ENCOUNTER — Encounter: Payer: Self-pay | Admitting: Internal Medicine

## 2016-11-09 ENCOUNTER — Encounter: Payer: Self-pay | Admitting: Internal Medicine

## 2016-12-22 ENCOUNTER — Other Ambulatory Visit: Payer: Self-pay | Admitting: Internal Medicine

## 2017-01-05 ENCOUNTER — Other Ambulatory Visit: Payer: Self-pay | Admitting: Internal Medicine

## 2017-01-31 ENCOUNTER — Other Ambulatory Visit: Payer: Self-pay | Admitting: Internal Medicine

## 2017-08-14 ENCOUNTER — Other Ambulatory Visit: Payer: Self-pay

## 2017-08-14 NOTE — Telephone Encounter (Signed)
Entered in error

## 2017-12-04 ENCOUNTER — Other Ambulatory Visit: Payer: Self-pay | Admitting: Internal Medicine

## 2018-01-29 IMAGING — US US ABDOMEN LIMITED
1 series · 14 of 25 positions shown · non-contrast
Comparison: None.

CLINICAL DATA: Elevated LFTs

EXAM:
US ABDOMEN LIMITED - RIGHT UPPER QUADRANT

[Series 1: us abdomen limited · 0.30mm/px · 14 of 53 slices shown]
[im 1/53]
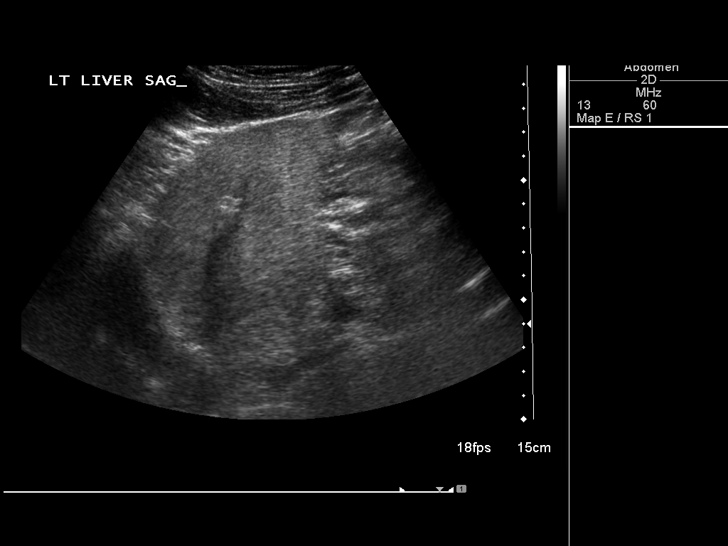
[im 5/53]
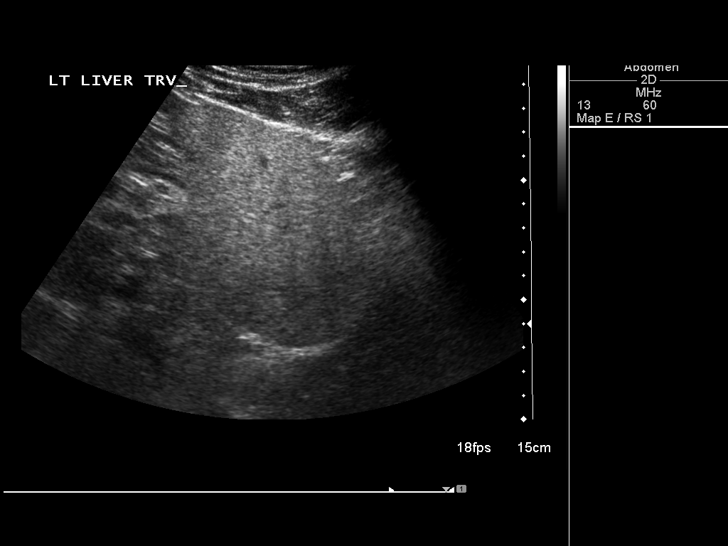
[im 9/53]
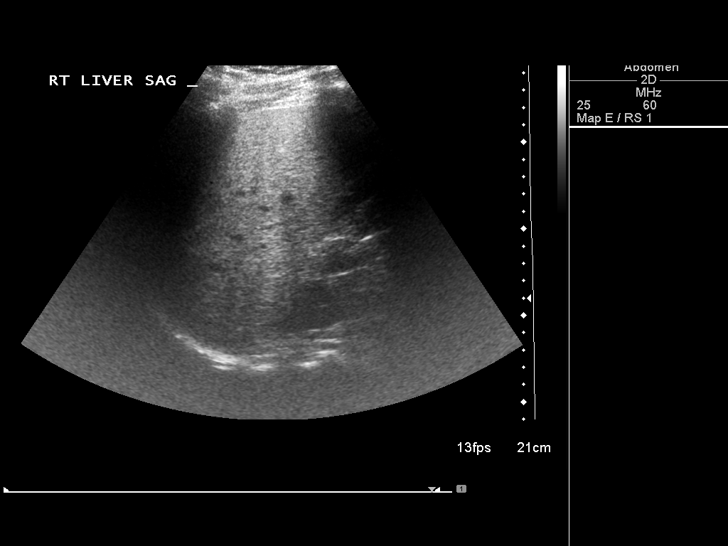
[im 14/53]
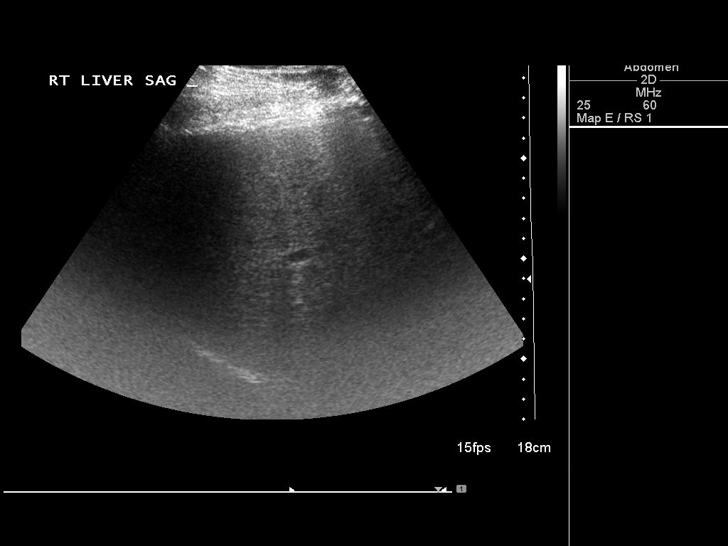
[im 18/53]
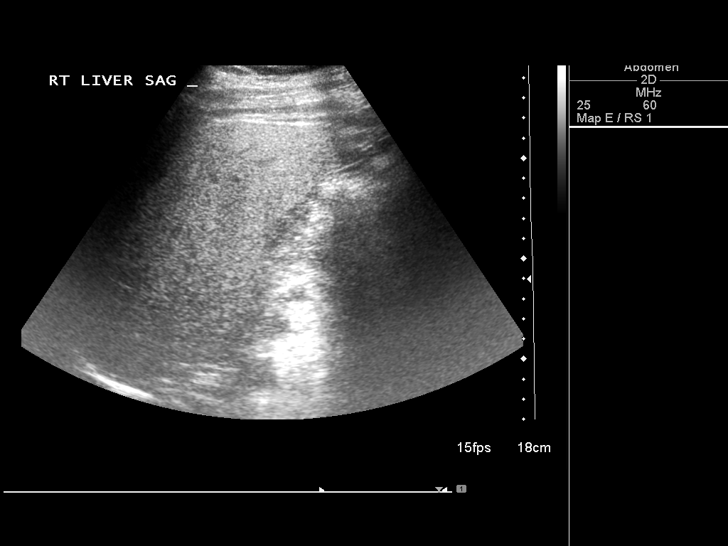
[im 20/53]
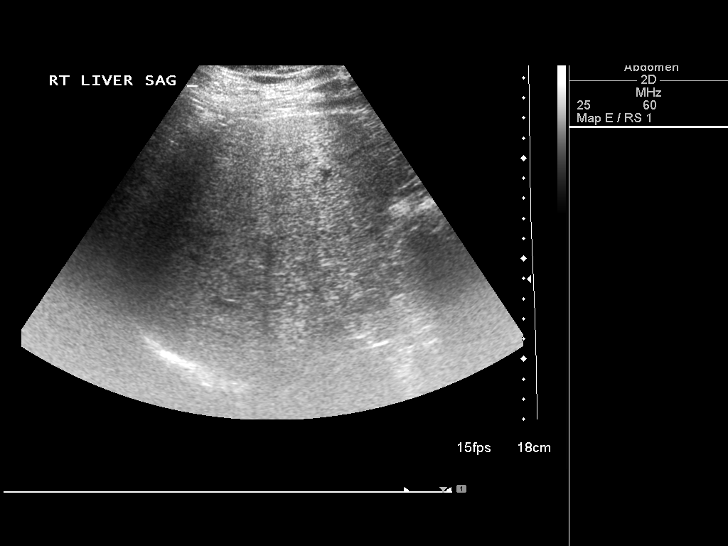
[im 24/53]
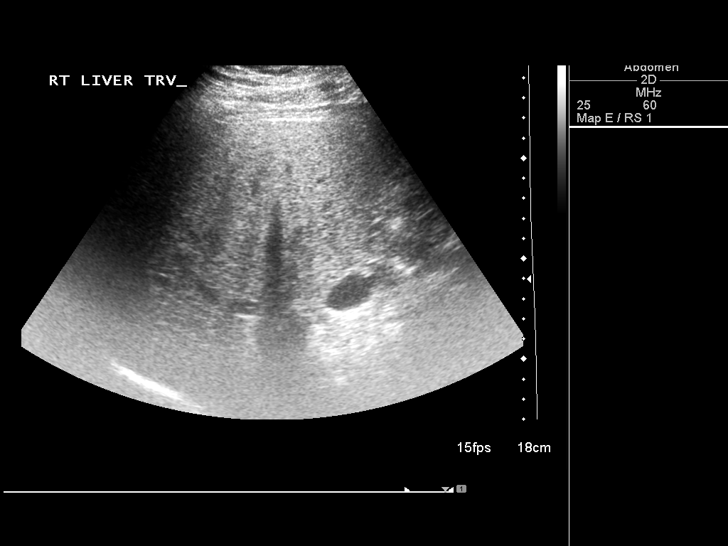
[im 29/53]
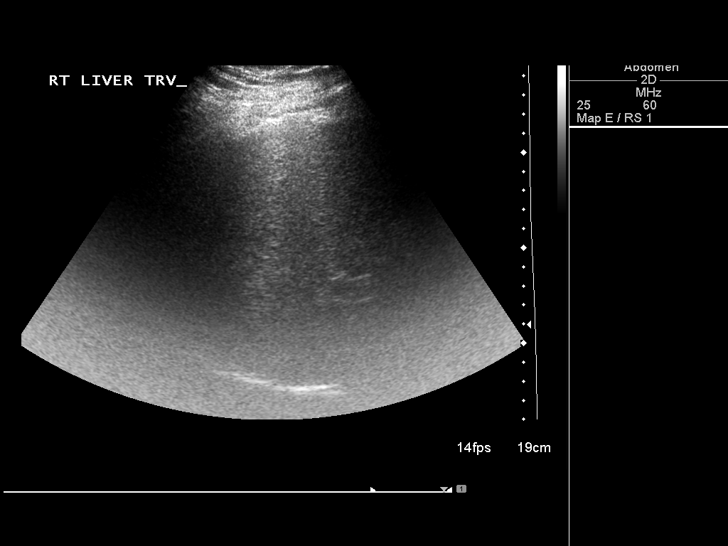
[im 33/53]
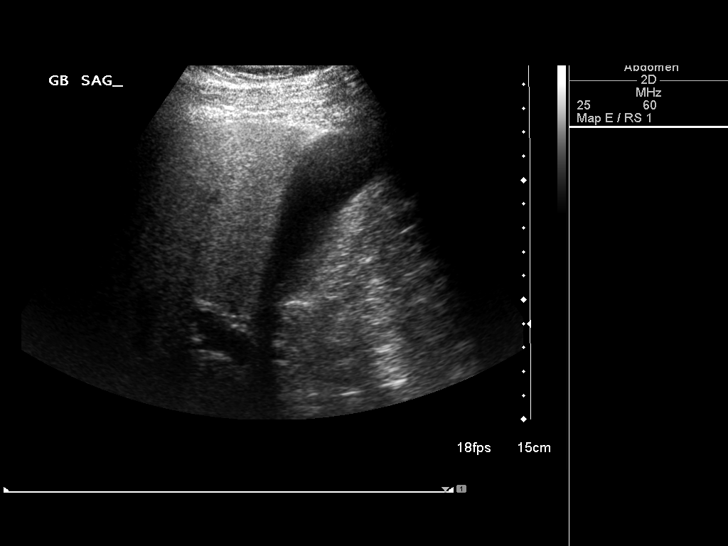
[im 35/53]
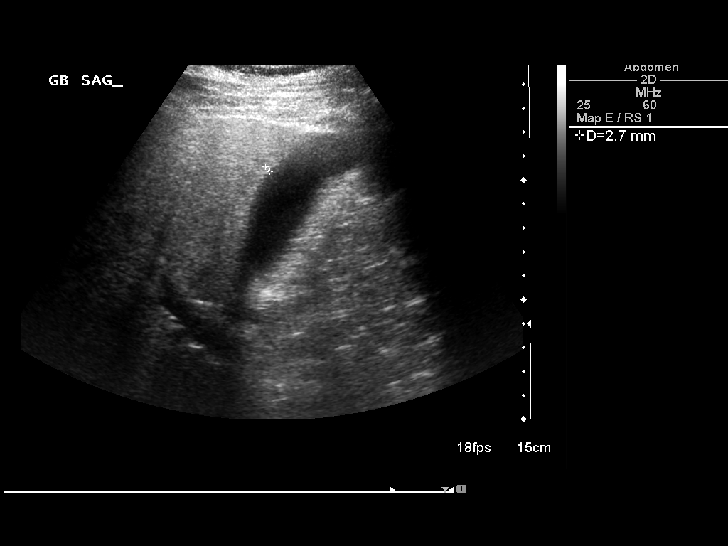
[im 40/53]
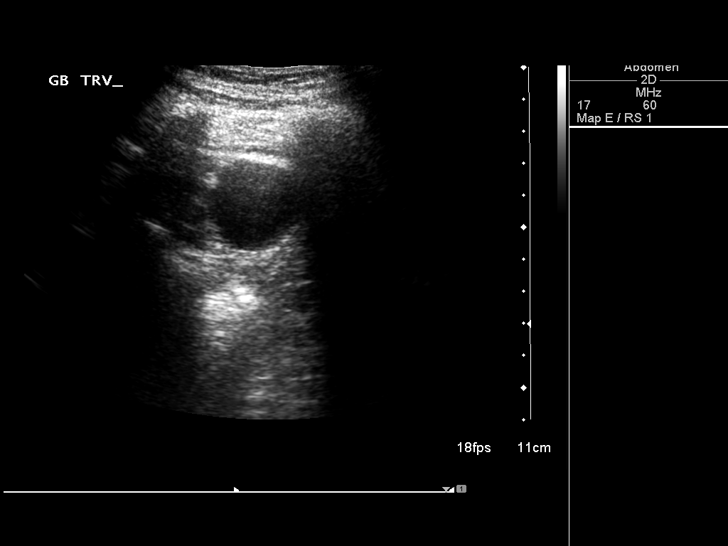
[im 44/53]
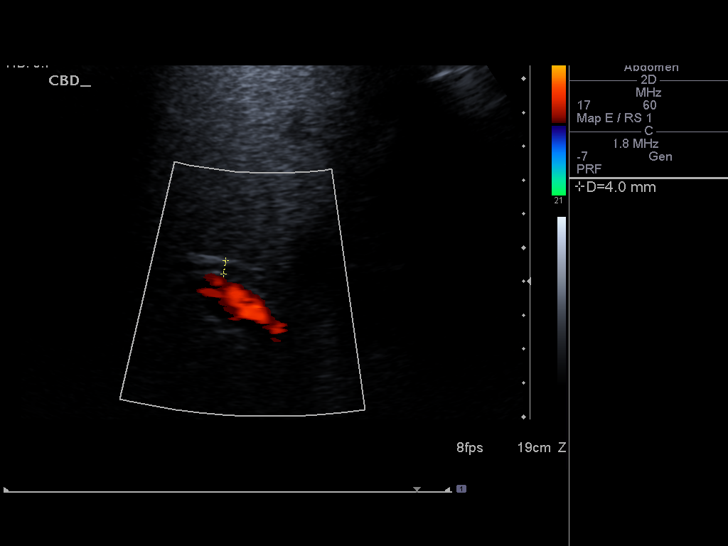
[im 48/53]
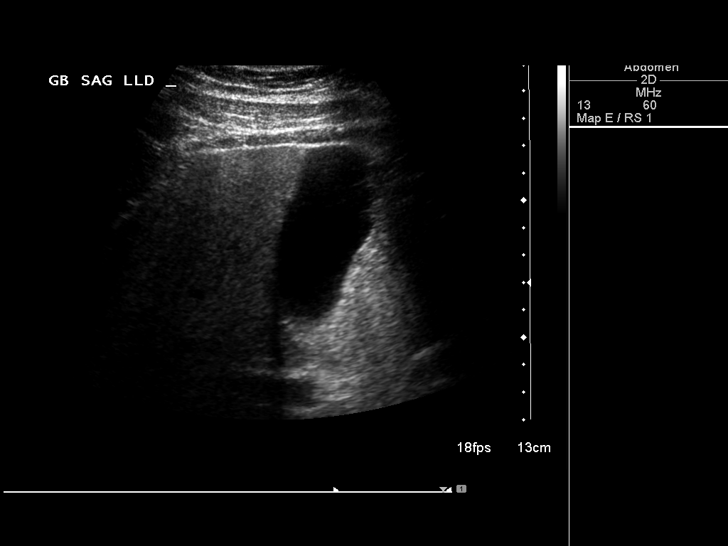
[im 53/53]
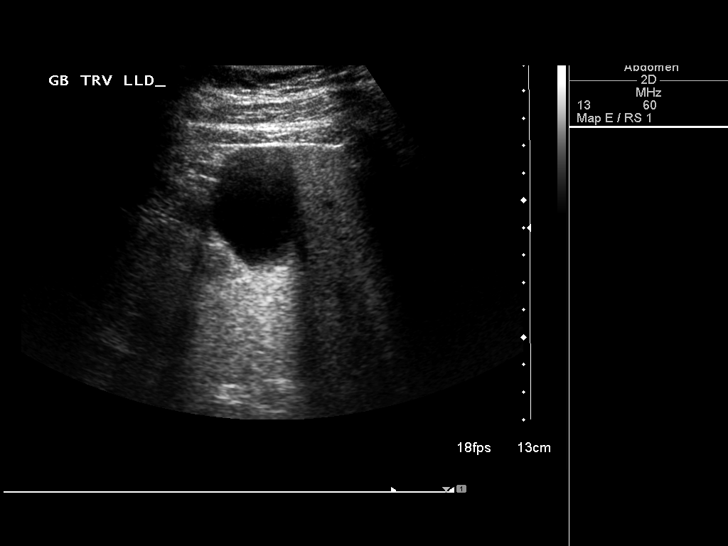

[14 of 25 positions shown; findings below may reference images not displayed]

FINDINGS: Gallbladder:

The gallbladder is visualized and no gallstones are noted. There is
no pain over the gallbladder with compression.

Common bile duct:

Diameter: The common bile duct is normal measuring 4.0 mm in
diameter.

Liver:

The parenchyma of the liver is increased consistent with fatty
infiltration. No focal hepatic abnormality is seen.
IMPRESSION: Echogenic liver parenchyma consistent with fatty infiltration. No
gallstones.

## 2018-03-02 ENCOUNTER — Other Ambulatory Visit: Payer: Self-pay | Admitting: Internal Medicine

## 2018-03-02 DIAGNOSIS — N529 Male erectile dysfunction, unspecified: Secondary | ICD-10-CM

## 2023-08-17 ENCOUNTER — Ambulatory Visit
Admission: EM | Admit: 2023-08-17 | Discharge: 2023-08-17 | Disposition: A | Discharge status: Home / Self Care | Payer: Self-pay | Attending: Nurse Practitioner | Admitting: Nurse Practitioner

## 2023-08-17 DIAGNOSIS — R21 Rash and other nonspecific skin eruption: Secondary | ICD-10-CM

## 2023-08-17 DIAGNOSIS — R030 Elevated blood-pressure reading, without diagnosis of hypertension: Secondary | ICD-10-CM

## 2023-08-17 MED ORDER — DEXAMETHASONE SODIUM PHOSPHATE 10 MG/ML IJ SOLN
10.0000 mg | Freq: Once | INTRAMUSCULAR | Status: AC
  Administered 2023-08-17: 10 mg via INTRAMUSCULAR

## 2023-08-17 NOTE — Discharge Instructions (Signed)
 We have given you a steroid shot today to help with itching and inflammation from the rash.  Continue Zyrtec to help with itching.  Recommend avoidance of topical steroids to the face.  Please monitor blood pressure and if remains elevated greater than 140/90, recommend follow-up with your primary care provider.  If you develop any chest pain, shortness of breath, dizziness or lightheadedness, vision changes, or headache, please seek care in the emergency room.

## 2023-08-17 NOTE — ED Provider Notes (Signed)
 [facility]    CSN: [id number] Arrival date & time: 08/17/23  1244      History   Chief Complaint No chief complaint on file.   HPI Joshua Schroeder is a 63 y.o. male.   Patient presents today with concern for poison ivy rash to his face.  Reports he was cutting down saplings yesterday and did not think there was poison ivy but woke up today with a red, splotchy, itchy face.  No shortness of breath or throat/tongue swelling.  Reports he also has a small area to his right wrist that is itching.  No fevers or nausea/vomiting.  No recent change in any detergents, soaps, personal care products.  He has taken Benadryl, Zyrtec, and tried topical hydrocortisone over-the-counter without much improvement.  Patient acknowledges that blood pressure is elevated today.  He denies history of hypertension, does not take anything for high blood pressure.  Reports he does follow-up with his primary care doctor regularly.  No shortness of breath, chest pain, vision changes, headache, dizziness or lightheadedness.    Past Medical History:  Diagnosis Date   ADD (attention deficit disorder)    Hyperlipidemia    Labile hypertension    Other testicular hypofunction    Prediabetes    Vitamin D  deficiency     Patient Active Problem List   Diagnosis Date Noted   Vitamin D  deficiency 07/09/2013   Medication management 07/09/2013   Hyperlipidemia    Hypertension    ADD (attention deficit disorder)    Testosterone  deficiency    Prediabetes     Past Surgical History:  Procedure Laterality Date   ANKLE SURGERY Right 1983   SHOULDER SURGERY Right 2006   VASECTOMY  1994       Home Medications    Prior to Admission medications   Medication Sig Start Date End Date Taking? Authorizing Provider  NEEDLE, DISP, 21 G 21G X 1" MISC Use for testosterone  injections, 3 ml 02/06/15   Collier, Amanda R, PA-C  OVER THE COUNTER MEDICATION Metamucil powder at bedtime.    [provider]   rosuvastatin  (CRESTOR ) 20 MG tablet Take 1 tablet (20 mg total) by mouth daily. For cholesterol 03/31/16   Vangie Genet, MD  sildenafil  (REVATIO ) 20 MG tablet TAKE ONE TO FIVE TABLETS BY MOUTH ONCE DAILY AS NEEDED 03/31/16   Vangie Genet, MD  testosterone  cypionate (DEPOTESTOSTERONE CYPIONATE) 200 MG/ML injection INJECT 1 ML INTO THE MUSCLE EVERY 14 DAYS 03/31/16   Vangie Genet, MD    Family History Family History  Problem Relation Age of Onset   Hyperlipidemia Mother    Hyperlipidemia Father     Social History Social History   Tobacco Use   Smoking status: Former   Smokeless tobacco: Never  Substance Use Topics   Alcohol use: No    Alcohol/week: 0.0 standard drinks of alcohol    Comment: social   Drug use: No     Allergies   Patient has no known allergies.   Review of Systems Review of Systems Per HPI  Physical Exam Triage Vital Signs ED Triage Vitals  Encounter Vitals Group     BP 08/17/23 1308 (!) 182/122     Systolic BP Percentile --      Diastolic BP Percentile --      Pulse Rate 08/17/23 1308 76     Resp 08/17/23 1308 18     Temp 08/17/23 1308 97.8 F (36.6 C)     Temp Source 08/17/23 1308 Oral  SpO2 08/17/23 1308 97 %     Weight --      Height --      Head Circumference --      Peak Flow --      Pain Score 08/17/23 1310 0     Pain Loc --      Pain Education --      Exclude from Growth Chart --    No data found.  Updated Vital Signs BP (!) 198/133 (BP Location: Right Arm)   Pulse 76   Temp 97.8 F (36.6 C) (Oral)   Resp 18   SpO2 97%   Visual Acuity Right Eye Distance:   Left Eye Distance:   Bilateral Distance:    Right Eye Near:   Left Eye Near:    Bilateral Near:     Physical Exam Vitals and nursing note reviewed.  Constitutional:      General: He is not in acute distress.    Appearance: Normal appearance. He is not toxic-appearing.  HENT:     Head: Normocephalic and atraumatic.     Mouth/Throat:     Mouth: Mucous  membranes are moist.     Pharynx: Oropharynx is clear. No oropharyngeal exudate or posterior oropharyngeal erythema.  Pulmonary:     Effort: Pulmonary effort is normal. No respiratory distress.  Skin:    General: Skin is warm and dry.     Capillary Refill: Capillary refill takes less than 2 seconds.     Coloration: Skin is not jaundiced or pale.     Findings: Erythema and rash present. Rash is macular and papular.     Comments: Maculopapular rash, slightly erythematous to face and right wrist.  No bruising or swelling.  Neurological:     Mental Status: He is alert and oriented to person, place, and time.  Psychiatric:        Behavior: Behavior is cooperative.      UC Treatments / Results  Labs (all labs ordered are listed, but only abnormal results are displayed) Labs Reviewed - No data to display  EKG   Radiology No results found.  Procedures Procedures (including critical care time)  Medications Ordered in UC Medications  dexamethasone (DECADRON) injection 10 mg (10 mg Intramuscular Given 08/17/23 1332)    Initial Impression / Assessment and Plan / UC Course  I have reviewed the triage vital signs and the nursing notes.  Pertinent labs & imaging results that were available during my care of the patient were reviewed by me and considered in my medical decision making (see chart for details).   Patient is significantly hypertensive in triage today, otherwise vital signs are stable.  Patient is asymptomatic of hypertension.  1. Rash and nonspecific skin eruption Suspect secondary to contact dermatitis due to plant IV Treat with Decadron 10 mg IM in urgent care today, continue oral antihistamine regimen Return for persistent/worsening symptoms despite treatment  2. Elevated blood pressure reading in office without diagnosis of hypertension Patient denies history of hypertension, not currently on any antihypertensives I recommended checking blood pressure at home,  notifying primary care provider for an appointment if greater than 140/90 at home Strict ER precautions in the meantime  The patient was given the opportunity to ask questions.  All questions answered to their satisfaction.  The patient is in agreement to this plan.   Final Clinical Impressions(s) / UC Diagnoses   Final diagnoses:  Rash and nonspecific skin eruption  Elevated blood pressure reading in office without diagnosis  of hypertension     Discharge Instructions      We have given you a steroid shot today to help with itching and inflammation from the rash.  Continue Zyrtec to help with itching.  Recommend avoidance of topical steroids to the face.  Please monitor blood pressure and if remains elevated greater than 140/90, recommend follow-up with your primary care provider.  If you develop any chest pain, shortness of breath, dizziness or lightheadedness, vision changes, or headache, please seek care in the emergency room.  ED Prescriptions   None    PDMP not reviewed this encounter.   Wilhemena Harbour, NP 08/17/23 [phone]

## 2023-08-17 NOTE — ED Triage Notes (Signed)
 Pt reports rash, itching and burning. States he ws cutting wood yesterday and got into some poison ivy.
